# Patient Record
Sex: Female | Born: 1940 | Race: White | Hispanic: No | Smoking: Former smoker
Health system: Southern US, Community
[De-identification: ages and names within clinical notes are randomized; demographics above are authoritative.]

## PROBLEM LIST (undated history)

## (undated) DIAGNOSIS — E119 Type 2 diabetes mellitus without complications: Secondary | ICD-10-CM

## (undated) DIAGNOSIS — H269 Unspecified cataract: Secondary | ICD-10-CM

## (undated) DIAGNOSIS — Z973 Presence of spectacles and contact lenses: Secondary | ICD-10-CM

## (undated) DIAGNOSIS — L209 Atopic dermatitis, unspecified: Secondary | ICD-10-CM

## (undated) DIAGNOSIS — E785 Hyperlipidemia, unspecified: Secondary | ICD-10-CM

## (undated) DIAGNOSIS — I700 Atherosclerosis of aorta: Secondary | ICD-10-CM

## (undated) DIAGNOSIS — K259 Gastric ulcer, unspecified as acute or chronic, without hemorrhage or perforation: Secondary | ICD-10-CM

## (undated) DIAGNOSIS — M1990 Unspecified osteoarthritis, unspecified site: Secondary | ICD-10-CM

## (undated) DIAGNOSIS — T7840XA Allergy, unspecified, initial encounter: Secondary | ICD-10-CM

## (undated) HISTORY — DX: Unspecified cataract: H26.9

## (undated) HISTORY — PX: ROTATOR CUFF REPAIR: SHX139

## (undated) HISTORY — PX: CHOLECYSTECTOMY: SHX55

## (undated) HISTORY — PX: TUBAL LIGATION: SHX77

## (undated) HISTORY — DX: Gastric ulcer, unspecified as acute or chronic, without hemorrhage or perforation: K25.9

## (undated) HISTORY — DX: Hyperlipidemia, unspecified: E78.5

## (undated) HISTORY — DX: Allergy, unspecified, initial encounter: T78.40XA

---

## 2021-03-04 ENCOUNTER — Ambulatory Visit (INDEPENDENT_AMBULATORY_CARE_PROVIDER_SITE_OTHER): Payer: No Typology Code available for payment source | Admitting: Internal Medicine

## 2021-03-04 ENCOUNTER — Other Ambulatory Visit: Payer: Self-pay

## 2021-03-04 ENCOUNTER — Encounter: Payer: Self-pay | Admitting: Internal Medicine

## 2021-03-04 VITALS — BP 148/82 | HR 90 | Temp 98.1°F | Resp 16 | Ht 64.0 in | Wt 152.0 lb

## 2021-03-04 DIAGNOSIS — Z789 Other specified health status: Secondary | ICD-10-CM | POA: Diagnosis not present

## 2021-03-04 DIAGNOSIS — R053 Chronic cough: Secondary | ICD-10-CM

## 2021-03-04 DIAGNOSIS — Z8711 Personal history of peptic ulcer disease: Secondary | ICD-10-CM

## 2021-03-04 DIAGNOSIS — E785 Hyperlipidemia, unspecified: Secondary | ICD-10-CM | POA: Diagnosis not present

## 2021-03-04 LAB — CBC WITH DIFFERENTIAL/PLATELET
Absolute Monocytes: 655 cells/uL (ref 200–950)
Basophils Absolute: 62 cells/uL (ref 0–200)
Basophils Relative: 0.8 %
Eosinophils Absolute: 406 cells/uL (ref 15–500)
Eosinophils Relative: 5.2 %
HCT: 44.7 % (ref 35.0–45.0)
Hemoglobin: 14.7 g/dL (ref 11.7–15.5)
Lymphs Abs: 2028 cells/uL (ref 850–3900)
MCH: 27.5 pg (ref 27.0–33.0)
MCHC: 32.9 g/dL (ref 32.0–36.0)
MCV: 83.7 fL (ref 80.0–100.0)
MPV: 11.2 fL (ref 7.5–12.5)
Monocytes Relative: 8.4 %
Neutro Abs: 4649 cells/uL (ref 1500–7800)
Neutrophils Relative %: 59.6 %
Platelets: 260 10*3/uL (ref 140–400)
RBC: 5.34 10*6/uL — ABNORMAL HIGH (ref 3.80–5.10)
RDW: 15.1 % — ABNORMAL HIGH (ref 11.0–15.0)
Total Lymphocyte: 26 %
WBC: 7.8 10*3/uL (ref 3.8–10.8)

## 2021-03-04 LAB — COMPLETE METABOLIC PANEL WITH GFR
AG Ratio: 1.3 (calc) (ref 1.0–2.5)
ALT: 8 U/L (ref 6–29)
AST: 15 U/L (ref 10–35)
Albumin: 4.2 g/dL (ref 3.6–5.1)
Alkaline phosphatase (APISO): 75 U/L (ref 37–153)
BUN: 17 mg/dL (ref 7–25)
CO2: 29 mmol/L (ref 20–32)
Calcium: 9.8 mg/dL (ref 8.6–10.4)
Chloride: 103 mmol/L (ref 98–110)
Creat: 0.7 mg/dL (ref 0.60–0.95)
Globulin: 3.3 g/dL (calc) (ref 1.9–3.7)
Glucose, Bld: 106 mg/dL — ABNORMAL HIGH (ref 65–99)
Potassium: 4.2 mmol/L (ref 3.5–5.3)
Sodium: 141 mmol/L (ref 135–146)
Total Bilirubin: 0.8 mg/dL (ref 0.2–1.2)
Total Protein: 7.5 g/dL (ref 6.1–8.1)
eGFR: 87 mL/min/{1.73_m2} (ref >=60)

## 2021-03-04 LAB — LIPID PANEL
Cholesterol: 324 mg/dL — ABNORMAL HIGH (ref <200)
HDL: 65 mg/dL (ref >=50)
LDL Cholesterol (Calc): 230 mg/dL (calc) — ABNORMAL HIGH
Non-HDL Cholesterol (Calc): 259 mg/dL (calc) — ABNORMAL HIGH (ref <130)
Total CHOL/HDL Ratio: 5 (calc) — ABNORMAL HIGH (ref <5.0)
Triglycerides: 137 mg/dL (ref <150)

## 2021-03-04 MED ORDER — PANTOPRAZOLE SODIUM 40 MG PO TBEC: 40.0000 mg | DELAYED_RELEASE_TABLET | Freq: Every day | ORAL | 3 refills | Status: DC

## 2021-03-04 NOTE — Assessment & Plan Note (Signed)
Stable, obtain CBC, CMP for baseline values. Switch from Pepcid to Protonix, can continue Carafate. Discuss lifestyle changes, foods and medications to avoid.

## 2021-03-04 NOTE — Assessment & Plan Note (Signed)
History of total cholesterol >300, cannot tolerate statins due to myalgias in the past. Will check lipid panel today, patient is fasting.

## 2021-03-04 NOTE — Progress Notes (Signed)
New Patient Office Visit  Subjective:  Patient ID: Kirsten Perez, female    DOB: [birth year]  Age: 81 y.o. MRN: [id number]  CC:  Chief Complaint  Patient presents with   Establish Care   Hyperlipidemia    Does not really want to be on statin due to side effects   Cough    Ongoing cough over 6 months   gastric ulcer    Had in summer wants to know if she needs to continue meds    HPI Kirsten Perez presents as a new patient.   Chronic cough:  -Dry cough since ulcer in May 2022 -Denies post-nasal drip  HLD: -Medications: Nothing currently, had been on multiple statins in the past and couldn't tolerate due to myalgias. Had been on Repatha as well but worried about cost  -Last lipid panel: total cholesterol >300 (per patient)  Gastric Ulcer: -Currently on Pepcid 40, Carafate 1 g daily -Was hospitalized after having dark red blood in stool in May, had an EGD and found to have a bleeding ulcer -Denies abdominal pain, nausea, vomiting, stool changes and bright red/dark stools currently   Health Maintenance: -Blood work due -Mammogram - does not continue with screening -Colon cancer screening - does not continue with screening -Immunizations: politely declines flu and PNA vaccine  Past Medical History:  Diagnosis Date   Gastric ulcer    Hyperlipidemia     Past Surgical History:  Procedure Laterality Date   CHOLECYSTECTOMY     ROTATOR CUFF REPAIR Right     Family History  Problem Relation Age of Onset   Hypertension Mother    Stroke Mother    Heart disease Father     Social History   Socioeconomic History   Marital status: Widowed    Spouse name: Not on file   Number of children: Not on file   Years of education: Not on file   Highest education level: Not on file  Occupational History   Not on file  Tobacco Use   Smoking status: Former    Types: Cigarettes   Smokeless tobacco: Never  Vaping Use   Vaping Use: Never used  Substance and Sexual Activity    Alcohol use: Never   Drug use: Never   Sexual activity: Not Currently  Other Topics Concern   Not on file  Social History Narrative   Not on file   Social Determinants of Health   Financial Resource Strain: Not on file  Food Insecurity: Not on file  Transportation Needs: Not on file  Physical Activity: Not on file  Stress: Not on file  Social Connections: Not on file  Intimate Partner Violence: Not on file    ROS Review of Systems  Constitutional:  Negative for chills and fever.  Respiratory:  Positive for cough. Negative for shortness of breath.   Cardiovascular:  Negative for chest pain.  Gastrointestinal:  Negative for abdominal pain, blood in stool, constipation, diarrhea, nausea and vomiting.  Neurological:  Negative for dizziness and headaches.   Objective:   Today's Vitals: BP (!) 148/82    Pulse 90    Temp 98.1 F (36.7 C)    Resp 16    Ht 5\' 4"  (1.626 m)    Wt 152 lb (68.9 kg)    SpO2 98%    BMI 26.09 kg/m   Physical Exam Constitutional:      Appearance: Normal appearance.  HENT:     Head: Normocephalic and atraumatic.     Mouth/Throat:  Mouth: Mucous membranes are moist.     Pharynx: Oropharynx is clear.  Eyes:     Conjunctiva/sclera: Conjunctivae normal.  Cardiovascular:     Rate and Rhythm: Normal rate and regular rhythm.  Pulmonary:     Effort: Pulmonary effort is normal.     Breath sounds: Normal breath sounds.  Abdominal:     General: Bowel sounds are normal. There is no distension.     Palpations: Abdomen is soft.     Tenderness: There is no abdominal tenderness.  Musculoskeletal:     Right lower leg: No edema.     Left lower leg: No edema.  Skin:    General: Skin is warm and dry.  Neurological:     General: No focal deficit present.     Mental Status: She is alert. Mental status is at baseline.  Psychiatric:        Mood and Affect: Mood normal.        Behavior: Behavior normal.    Assessment & Plan:   Problem List Items Addressed  This Visit       Other   HLD (hyperlipidemia) - Primary    History of total cholesterol >300, cannot tolerate statins due to myalgias in the past. Will check lipid panel today, patient is fasting.      Relevant Orders   Lipid Profile   Statin intolerance   History of gastric ulcer    Stable, obtain CBC, CMP for baseline values. Switch from Pepcid to Protonix, can continue Carafate. Discuss lifestyle changes, foods and medications to avoid.       Relevant Orders   CBC w/Diff/Platelet   COMPLETE METABOLIC PANEL WITH GFR   Other Visit Diagnoses     Chronic cough - most likely due to silent reflux, treat with PPI and follow up in 6 months.        Outpatient Encounter Medications as of 03/04/2021  Medication Sig   benzonatate (TESSALON) 100 MG capsule Take by mouth 3 (three) times daily as needed for cough.   famotidine (PEPCID) 40 MG tablet Take 40 mg by mouth daily.   Multiple Vitamin (MULTIVITAMIN) capsule Take 1 capsule by mouth daily.   sucralfate (CARAFATE) 1 g tablet Take 1 g by mouth 4 (four) times daily -  with meals and at bedtime.   No facility-administered encounter medications on file as of 03/04/2021.    Follow-up: Return in about 6 months (around 09/01/2021).   Margarita Mail, DO

## 2021-03-04 NOTE — Patient Instructions (Signed)
It was great seeing you today!  Plan discussed at today's visit: -Blood work ordered today, results will be uploaded to MyChart.  -Stomach medication switched to Protonix 40 mg daily, can continue Carafate -Can continue to take cough medication as needed  -Avoid anti-inflammatory medication like Advil, Ibuprofen, etc ok to to Tylenol  -Please monitor stools for any kind of blood, bright red or black and please present to the ER if this happens   Follow up in: 6 months   Take care and let us know if you have any questions or concerns prior to your next visit.  Dr. Caralee Ates

## 2021-03-19 MED ORDER — REPATHA SURECLICK 140 MG/ML ~~LOC~~ SOAJ: 140.0000 mg | SUBCUTANEOUS | 3 refills | Status: DC

## 2021-03-19 NOTE — Addendum Note (Signed)
Addended by: Margarita Mail on: 03/19/2021 10:18 AM   Modules accepted: Orders

## 2021-03-20 ENCOUNTER — Telehealth: Payer: Self-pay

## 2021-03-20 NOTE — Telephone Encounter (Signed)
Copied from CRM [id number]. Topic: General - Inquiry >> Mar 20, 2021 11:01 AM Crist Infante wrote: Reason for CRM: pt got notice from her pharmacy that the Evolocumab (REPATHA SURECLICK) 140 MG/ML SOAJ will be $100 /mo. Pt wants to know is that with her insurance, b/c she cannot afford this a month.

## 2021-03-21 NOTE — Telephone Encounter (Signed)
Received prior auth from pharmacy we try

## 2021-04-04 ENCOUNTER — Ambulatory Visit: Payer: No Typology Code available for payment source

## 2021-04-04 ENCOUNTER — Telehealth: Payer: Self-pay

## 2021-04-04 DIAGNOSIS — Z8711 Personal history of peptic ulcer disease: Secondary | ICD-10-CM

## 2021-04-04 NOTE — Telephone Encounter (Signed)
Patient do want an appointment she states she has already had a egd with different provider and dont feel this appointment is necessary ?

## 2021-04-04 NOTE — Telephone Encounter (Signed)
Patient was scheduled for Annual Wellness visit today but unable to verify last date of AWV due to pt  is new to office and we do not have any records from previous provider.  ? ?Pt's previous PCP Dr. Nicholaus Bloom in [place]. Ph # [phone]) [phone]. I called and left a message to fax records to our office. Pt states she did not sign a record release at last office visit and her appt today was virtual.  ? ?Pt states she is still experiencing a frequent dry cough that is occasionally productive with clear phlegm. She is taking benzonatate every 3-4 days PRN and using cough drops. Pt does not think her cough is reflux related and would like to know if she needs to be referred pulmonology or gastroenterology here as she moved to [facility] in Dec 2022.  ? ?Pt does not have a future appt with Dr. Caralee Ates, last note states to follow up in 6 months. Please advise patient if she needs to be seen for further evaluation or provide recommendations pertaining to her cough. Thank you.  ?

## 2021-04-25 NOTE — Progress Notes (Deleted)
Erroneous encounter. Need prior records to verify patient's eligibility for AWV.  ?

## 2021-05-16 DIAGNOSIS — J309 Allergic rhinitis, unspecified: Secondary | ICD-10-CM | POA: Diagnosis not present

## 2021-05-16 DIAGNOSIS — R053 Chronic cough: Secondary | ICD-10-CM | POA: Diagnosis not present

## 2021-05-31 DIAGNOSIS — R059 Cough, unspecified: Secondary | ICD-10-CM | POA: Diagnosis not present

## 2021-05-31 DIAGNOSIS — Z87898 Personal history of other specified conditions: Secondary | ICD-10-CM | POA: Diagnosis not present

## 2021-05-31 DIAGNOSIS — J40 Bronchitis, not specified as acute or chronic: Secondary | ICD-10-CM | POA: Diagnosis not present

## 2021-06-03 ENCOUNTER — Telehealth: Payer: Self-pay

## 2021-06-03 NOTE — Telephone Encounter (Signed)
Pt notified, she can take meds as directed ?

## 2021-06-03 NOTE — Telephone Encounter (Signed)
Copied from CRM [id number]. Topic: Appointment Scheduling - Scheduling Inquiry for Clinic ?>> Jun 03, 2021  2:47 PM Kirsten Perez wrote: ?Reason for CRM: Pt has an appt tomorrow and wanted to know if Dr. Caralee Ates would also be doing blood work for her cholesterol / she wanted to make sure it was ok to take medications in the morning and if they would effect blood work especially an RX she was prescribed out of town for her cough/ please advise asap ?

## 2021-06-04 ENCOUNTER — Ambulatory Visit (INDEPENDENT_AMBULATORY_CARE_PROVIDER_SITE_OTHER): Payer: No Typology Code available for payment source | Admitting: Internal Medicine

## 2021-06-04 ENCOUNTER — Encounter: Payer: Self-pay | Admitting: Internal Medicine

## 2021-06-04 VITALS — BP 138/62 | HR 89 | Temp 98.1°F | Resp 18 | Wt 157.2 lb

## 2021-06-04 DIAGNOSIS — R053 Chronic cough: Secondary | ICD-10-CM | POA: Diagnosis not present

## 2021-06-04 DIAGNOSIS — Z8711 Personal history of peptic ulcer disease: Secondary | ICD-10-CM

## 2021-06-04 MED ORDER — PULMICORT FLEXHALER 90 MCG/ACT IN AEPB: 1.0000 | INHALATION_SPRAY | Freq: Two times a day (BID) | RESPIRATORY_TRACT | 0 refills | Status: DC

## 2021-06-04 MED ORDER — PANTOPRAZOLE SODIUM 40 MG PO TBEC: 40.0000 mg | DELAYED_RELEASE_TABLET | Freq: Every day | ORAL | 1 refills | Status: DC

## 2021-06-04 MED ORDER — SUCRALFATE 1 G PO TABS
1.0000 g | ORAL_TABLET | Freq: Three times a day (TID) | ORAL | 1 refills | Status: DC
Start: 2021-06-04 — End: 2022-01-17

## 2021-06-04 MED ORDER — BENZONATATE 100 MG PO CAPS: 100.0000 mg | ORAL_CAPSULE | Freq: Three times a day (TID) | ORAL | 0 refills | Status: DC | PRN

## 2021-06-04 NOTE — Patient Instructions (Addendum)
It was great seeing you today! ? ?Plan discussed at today's visit: ?-Finish medrol dosepack ?-use steroid inhaler (Pulmicort) daily and rinse out mouth afterwards ?-Can use Albuterol as needed ever 2-4 hours for wheezing, etc.  ?-Referrals to GI and lung doctor today  ? ?Follow up in: 1 month  ? ?Take care and let us know if you have any questions or concerns prior to your next visit. ? ?Dr. Caralee Ates ? ?

## 2021-06-04 NOTE — Progress Notes (Signed)
? ?Acute Office Visit ? ?Subjective:  ? ?  ?Patient ID: Kirsten Perez, female    DOB: Sep 27, 1940, 81 y.o.   MRN: [id number] ? ?Chief Complaint  ?Patient presents with  ? Cough  ?  Onset months  ? ? ?HPI ?Patient is in today for cough. Symptoms have been going on now for about 3 months, cough getting worse.  Cough is dry in nature, occasionally will be able to cough up a small amount of clear mucus.  She did go to urgent care [place] was [place], she was not given any prescription medication was told her symptoms were due to seasonal allergies.  It was recommended she start an oral antihistamine.  She then saw a provider at [facility], who did a chest x-ray which was normal.  She was then started on albuterol as needed and Medrol Dosepak.  She has about 2 days left of her Medrol Dosepak.  She states she has not noticed any change in her symptoms with this medication.  She is also using albuterol inhaler, however she is uncertain if she is using the inhaler correctly and getting the full dose of medication.  She notes no changes in her symptoms with using the albuterol inhaler.  She has no history of asthma or COPD.  She does not smoke.  Chest [facility] through [facility] negative on 05/31/21, however I am unable to view these records.  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  She does occasionally have some breakthrough acid reflux symptoms, such as epigastric pain and burning type pain in her esophagus.  She denies nausea or vomiting.  Appetite is good and weight is stable. ? ?URI Compliant:  ?-Worst symptom: cough ?-Fever: no ?-Cough: yes, dry ?-Shortness of breath: no ?-Wheezing: yes ?-Chest pain: no ?-Chest tightness: yes ?-Chest congestion: yes ?-Nasal congestion: no ?-Runny nose: no ?-Post nasal drip: no ?-Sneezing: yes ?-Sore throat: no ?-Swollen glands: no ?-Sinus pressure: yes ?-Headache: no ?-Face pain: no ?-Ear pain: no  ?-Ear pressure: no  ?-Eyes  red/itching:no ?-Eye drainage/crusting: no  ?-Vomiting: no ?-Rash: no ?-Fatigue: yes with activity buy not all the time  ?-Sick contacts: no ?-Context: worse ?-Treatments attempted: Albuterol inhaler; Medrol dosepack  ? ? ?Review of Systems  ?Constitutional:  Negative for chills, fever and weight loss.  ?HENT:  Negative for congestion, ear pain, sinus pain and sore throat.   ?Eyes:  Negative for discharge and redness.  ?Respiratory:  Positive for cough and wheezing. Negative for sputum production and shortness of breath.   ?Cardiovascular:  Negative for chest pain.  ?Gastrointestinal:  Positive for heartburn. Negative for abdominal pain, nausea and vomiting.  ?Neurological:  Negative for headaches.  ? ? ?   ?Objective:  ?  ?BP 138/62   Pulse 89   Temp 98.1 ?F (36.7 ?C)   Resp 18   Wt 157 lb 3.2 oz (71.3 kg)   SpO2 97%   BMI 26.98 kg/m?  ?BP Readings from Last 3 Encounters:  ?06/04/21 138/62  ?03/04/21 (!) 148/82  ? ?Wt Readings from Last 3 Encounters:  ?06/04/21 157 lb 3.2 oz (71.3 kg)  ?03/04/21 152 lb (68.9 kg)  ? ?  ? ?Physical Exam ?Constitutional:   ?   Appearance: Normal appearance.  ?HENT:  ?   Head: Normocephalic and atraumatic.  ?   Right Ear: Tympanic membrane, ear canal and external ear normal.  ?   Left Ear: Tympanic membrane, ear  canal and external ear normal.  ?   Nose: Nose normal.  ?   Mouth/Throat:  ?   Mouth: Mucous membranes are moist.  ?   Comments: Mild postnasal drip present, no exudate ?Eyes:  ?   Conjunctiva/sclera: Conjunctivae normal.  ?Cardiovascular:  ?   Rate and Rhythm: Normal rate and regular rhythm.  ?Pulmonary:  ?   Effort: Pulmonary effort is normal.  ?   Breath sounds: Normal breath sounds. No wheezing, rhonchi or rales.  ?   Comments: Decreased air movement throughout, however no rhonchi or wheezing present ?Musculoskeletal:  ?   Right lower leg: No edema.  ?   Left lower leg: No edema.  ?Skin: ?   General: Skin is warm and dry.  ?Neurological:  ?   General: No focal deficit  present.  ?   Mental Status: She is alert. Mental status is at baseline.  ?Psychiatric:     ?   Mood and Affect: Mood normal.     ?   Behavior: Behavior normal.  ? ? ?No results found for any visits on 06/04/21. ? ? ?   ?Assessment & Plan:  ? ?1. Chronic cough: Uncertain etiology, does not appear to be infectious as is been going on for 3 months.  She did have a normal chest x-ray through [facility] last week, however I am not able to view these results.  Main symptoms are chronic dry cough and wheezing.  I will send her in a steroid inhaler and we also discussed using a spacer as well to ensure she is getting the full dose of medication.  She will use a steroid inhaler daily, rinse out her mouth afterwards and then use albuterol as needed for wheezing and shortness of breath.  I will refill her cough suppressant medication, however I am concerned this may be asthma related and she will be sent to pulmonology for PFTs.  She will continue her Medrol Dosepak. ? ?- benzonatate (TESSALON) 100 MG capsule; Take 1 capsule (100 mg total) by mouth 3 (three) times daily as needed for cough.  Dispense: 30 capsule; Refill: 0 ?- Budesonide (PULMICORT FLEXHALER) 90 MCG/ACT inhaler; Inhale 1 puff into the lungs 2 (two) times daily.  Dispense: 1 each; Refill: 0 ?- Ambulatory referral to Pulmonology ? ?2. History of gastric ulcer: Questionable if this is contributing to chronic cough.  She does take Protonix 40 mg daily, this will be refilled today.  Her Carafate 1 g 4 times a day with meals will be refilled as well.  She will be referred to GI for continued medical management of duodenal ulcer. ? ?- pantoprazole (PROTONIX) 40 MG tablet; Take 1 tablet (40 mg total) by mouth daily.  Dispense: 90 tablet; Refill: 1 ?- sucralfate (CARAFATE) 1 g tablet; Take 1 tablet (1 g total) by mouth 4 (four) times daily -  with meals and at bedtime.  Dispense: 360 tablet; Refill: 1 ?- Ambulatory referral to Gastroenterology ? ? ?Meds ordered this  encounter  ?Medications  ? pantoprazole (PROTONIX) 40 MG tablet  ?  Sig: Take 1 tablet (40 mg total) by mouth daily.  ?  Dispense:  90 tablet  ?  Refill:  1  ? benzonatate (TESSALON) 100 MG capsule  ?  Sig: Take 1 capsule (100 mg total) by mouth 3 (three) times daily as needed for cough.  ?  Dispense:  30 capsule  ?  Refill:  0  ? sucralfate (CARAFATE) 1 g tablet  ?  Sig: Take 1 tablet (1 g total) by mouth 4 (four) times daily -  with meals and at bedtime.  ?  Dispense:  360 tablet  ?  Refill:  1  ? Budesonide (PULMICORT FLEXHALER) 90 MCG/ACT inhaler  ?  Sig: Inhale 1 puff into the lungs 2 (two) times daily.  ?  Dispense:  1 each  ?  Refill:  0  ? ? ?Return in about 4 weeks (around 07/02/2021). ? ?Margarita Mail, DO ? ? ?

## 2021-06-10 ENCOUNTER — Ambulatory Visit: Payer: No Typology Code available for payment source | Admitting: Internal Medicine

## 2021-07-05 ENCOUNTER — Telehealth: Payer: Self-pay | Admitting: Internal Medicine

## 2021-07-05 ENCOUNTER — Ambulatory Visit: Payer: Self-pay | Admitting: *Deleted

## 2021-07-05 DIAGNOSIS — H35371 Puckering of macula, right eye: Secondary | ICD-10-CM | POA: Diagnosis not present

## 2021-07-05 DIAGNOSIS — H524 Presbyopia: Secondary | ICD-10-CM | POA: Diagnosis not present

## 2021-07-05 DIAGNOSIS — Z789 Other specified health status: Secondary | ICD-10-CM

## 2021-07-05 DIAGNOSIS — E785 Hyperlipidemia, unspecified: Secondary | ICD-10-CM

## 2021-07-05 MED ORDER — REPATHA SURECLICK 140 MG/ML ~~LOC~~ SOAJ: 140.0000 mg | SUBCUTANEOUS | 3 refills | Status: DC

## 2021-07-05 NOTE — Telephone Encounter (Signed)
  Chief Complaint: She is needing clarification from Dr. Caralee Ates regarding the Repatha Sureclick 140 mg/ml SOAJ.  Does she want pt to continue taking it?   Her shot is due this Tues.   She has an appt. With Dr. Caralee Ates this [place].  If Dr. Caralee Ates wants her to continue it she will need a refill. Symptoms: N/A Frequency: N/A Pertinent Negatives: Patient denies N/A Disposition: [] ED /[] Urgent Care (no appt availability in office) / [] Appointment(In office/virtual)/ []  [place] [facility]/ [] Home Care/ [] Refused Recommended Disposition /[] [facility] Follow-up with PCP Additional Notes: Message sent to Dr. 

## 2021-07-05 NOTE — Telephone Encounter (Deleted)
Pt would like to know if PCP Caralee Ates would like for her to continue taking medication Evolocumab (REPATHA SURECLICK) 140 MG/ML SOAJ  Pt stated she is unsure if PCP wants her to continue taking it. However, if she does, she needs a refill.     Please advise.

## 2021-07-05 NOTE — Telephone Encounter (Deleted)
Error

## 2021-07-05 NOTE — Addendum Note (Signed)
Addended by: Margarita Mail on: 07/05/2021 01:01 PM   Modules accepted: Orders

## 2021-07-05 NOTE — Telephone Encounter (Signed)
Message from [place] sent at 07/05/2021  9:42 AM EDT  Summary: Medication Management   Pt would like to know if PCP Caralee Ates would like for her to continue taking medication Evolocumab (REPATHA SURECLICK) 140 MG/ML SOAJ   Pt stated she is unsure if PCP wants her to continue taking it. However, pt mentioned if she does, she needs a refill.     Pt seeking clinical advice.           Call History   Type Contact Phone/Fax User  07/05/2021 09:41 AM EDT Phone (Incoming) [place], [place]      Reason for Disposition  [1] Caller has URGENT medicine question about med that PCP or specialist prescribed AND [2] triager unable to answer question  Answer Assessment - Initial Assessment Questions 1. NAME of MEDICATION: "What medicine are you calling about?"     Repatha Sureclick 2. QUESTION: "What is your question?" (e.g., double dose of medicine, side effect)     Sunday at pharmacy they didn't have it.   They are trying to contact the dr for a refill.   Dr. Caralee Ates' office said the pharmacy hasn't contacted them. 3. PRESCRIBING HCP: "Who prescribed it?" Reason: if prescribed by specialist, call should be referred to that group.     Dr. Caralee Ates 4. SYMPTOMS: "Do you have any symptoms?"     I need to know if I need a refill or what she wants me to do. 5. SEVERITY: If symptoms are present, ask "Are they mild, moderate or severe?"      6. PREGNANCY:  "Is there any chance that you are pregnant?" "When was your last menstrual period?"  Protocols used: Medication Question Call-A-AH

## 2021-07-11 ENCOUNTER — Encounter: Payer: Self-pay | Admitting: Internal Medicine

## 2021-07-11 ENCOUNTER — Ambulatory Visit (INDEPENDENT_AMBULATORY_CARE_PROVIDER_SITE_OTHER): Payer: No Typology Code available for payment source | Admitting: Internal Medicine

## 2021-07-11 VITALS — BP 122/72 | HR 72 | Temp 97.7°F | Resp 16 | Ht 64.0 in | Wt 154.1 lb

## 2021-07-11 DIAGNOSIS — R222 Localized swelling, mass and lump, trunk: Secondary | ICD-10-CM

## 2021-07-11 DIAGNOSIS — J383 Other diseases of vocal cords: Secondary | ICD-10-CM | POA: Diagnosis not present

## 2021-07-11 DIAGNOSIS — K921 Melena: Secondary | ICD-10-CM

## 2021-07-11 DIAGNOSIS — Z8711 Personal history of peptic ulcer disease: Secondary | ICD-10-CM | POA: Diagnosis not present

## 2021-07-11 DIAGNOSIS — K5900 Constipation, unspecified: Secondary | ICD-10-CM | POA: Diagnosis not present

## 2021-07-11 DIAGNOSIS — E785 Hyperlipidemia, unspecified: Secondary | ICD-10-CM

## 2021-07-11 NOTE — Progress Notes (Signed)
Established Patient Office Visit  Subjective:  Patient ID: Kirsten Perez, female    DOB: 1940/09/24  Age: 81 y.o. MRN: [id number]  CC:  Chief Complaint  Patient presents with   Follow-up   Hyperlipidemia   Gastroesophageal Reflux    HPI Kirsten Perez is a 81 year old female here for follow up on chronic medical conditions.   Chronic cough:  -Dry cough since ulcer in May 2022 -Denies post-nasal drip -At LOV treated with Medrol dosepack, Pulmicort inhaler and Albuterol PRN but none of these things are helping. Did discuss spacer which she has not tried yet but she does feel like she is fully inhaling the medications -Referral placed to Pulmonology - hasn't seen them yet -Now saying that she feels a whistling sensation to the wheezing, and it feels higher up in her respiratory tract. Denies congestion, sinus pain/pressure. Does nasal steroid sprays but hasn't helped -Also endorsing a fullness above her clavicle on the left side, non-painful. Staying the same, just noticed a few weeks ago. Does also endorse a fullness when swallowing, first noticed issue with pills but doesn't matter the size. No pain with swallowing. No issue with swallowing solid food or liquids.  HLD: -Medications: Repatha, had been on multiple statins in the past and couldn't tolerate due to myalgias. -Last lipid panel: Lipid Panel     Component Value Date/Time   CHOL 324 (H) 03/04/2021 1220   TRIG 137 03/04/2021 1220   HDL 65 03/04/2021 1220   CHOLHDL 5.0 (H) 03/04/2021 1220   LDLCALC 230 (H) 03/04/2021 1220    Gastric Ulcer: -Currently on Protonix 40, Carafate 1 g daily (had been on Pepcid previously, does not notice a difference between Pepcid and Protonix.) -Was hospitalized after having dark red blood in stool in May, 2022 had an EGD and found to have a bleeding ulcer in [place] -Today denies abdominal pain, nausea, vomiting but does endorse pink bloody streaks in stools that lasted about 1 week  about 3 weeks ago.  She does have chronic constipation.  She usually only has a bowel movement about once a week.  She does have to strain to have bowel movements and does have a history of hemorrhoids.  She has not noticed any bright red blood, dark or black stools in the last 3 weeks.  She denies dizziness, fatigue.  Health Maintenance: -Blood work up-to-date, plan to recheck cholesterol today -Mammogram - does not continue with screening -Colon cancer screening - does not continue with screening -Immunizations: politely declines flu and PNA vaccine  Past Medical History:  Diagnosis Date   Gastric ulcer    Hyperlipidemia     Past Surgical History:  Procedure Laterality Date   CHOLECYSTECTOMY     ROTATOR CUFF REPAIR Right     Family History  Problem Relation Age of Onset   Hypertension Mother    Stroke Mother    Heart disease Father     Social History   Socioeconomic History   Marital status: Widowed    Spouse name: Not on file   Number of children: Not on file   Years of education: Not on file   Highest education level: Not on file  Occupational History   Not on file  Tobacco Use   Smoking status: Former    Types: Cigarettes   Smokeless tobacco: Never  Vaping Use   Vaping Use: Never used  Substance and Sexual Activity   Alcohol use: Never   Drug use: Never   Sexual  activity: Not Currently  Other Topics Concern   Not on file  Social History Narrative   Not on file   Social Determinants of Health   Financial Resource Strain: Not on file  Food Insecurity: Not on file  Transportation Needs: Not on file  Physical Activity: Not on file  Stress: Not on file  Social Connections: Not on file  Intimate Partner Violence: Not on file    ROS Review of Systems  Constitutional:  Negative for chills, fatigue and fever.  HENT:  Positive for trouble swallowing and voice change. Negative for congestion, postnasal drip, rhinorrhea, sinus pressure, sinus pain and sore  throat.   Respiratory:  Positive for cough and wheezing. Negative for shortness of breath.   Cardiovascular:  Negative for chest pain.  Gastrointestinal:  Positive for blood in stool. Negative for abdominal pain, constipation, diarrhea, nausea and vomiting.  Neurological:  Negative for dizziness and headaches.    Objective:   Today's Vitals: There were no vitals taken for this visit.  Physical Exam Constitutional:      Appearance: Normal appearance.  HENT:     Head: Normocephalic and atraumatic.     Mouth/Throat:     Mouth: Mucous membranes are moist.     Pharynx: Oropharynx is clear.  Eyes:     Conjunctiva/sclera: Conjunctivae normal.  Neck:     Comments: Fullness palpated in the proximal left supra clavicular area, no pain to palpation Cardiovascular:     Rate and Rhythm: Normal rate and regular rhythm.  Pulmonary:     Effort: Pulmonary effort is normal.     Breath sounds: Normal breath sounds.  Musculoskeletal:     Right lower leg: No edema.     Left lower leg: No edema.  Skin:    General: Skin is warm and dry.  Neurological:     General: No focal deficit present.     Mental Status: She is alert. Mental status is at baseline.  Psychiatric:        Mood and Affect: Mood normal.        Behavior: Behavior normal.     Assessment & Plan:   1. Blood in stool/Constipation, unspecified constipation type/History of gastric ulcer: Patient endorsing stools streaked with pink blood about 3 weeks ago.  This lasted about 1 week.  She does have a GI appointment in September.  She is compliant with her Protonix 40 mg daily, Carafate 1 g daily.  Denies abdominal pain, nausea, vomiting or diarrhea today but does have a history of constipation and also known history of hemorrhoids.  Blood in stool she is describing is most likely secondary to hemorrhoids, however we will obtain a fecal occult test today and check hemoglobin to confirm that it is stable.  Discussed treatment of constipation  with increasing hydration, dietary fiber and MiraLAX.  Recheck in 3 months.  - CBC w/Diff/Platelet  2. Hyperlipidemia, unspecified hyperlipidemia type: Last lipid panel in February, LDL elevated at 230.  The patient has been on Repatha since February and would like to recheck a lipid panel to make sure the medication is working, as it is a high co-pay for her.  - Lipid Profile  3. Vocal cord dysfunction: Has been dealing with this cough and now wheezing for about a year.  Oral steroids, nasal steroids, inhaled steroids and albuterol not changing symptoms.  Now she is describing a whistling like sound coming from her upper airway.  At this point we will refer to ENT for complete  exam of the larynx and vocal cords, suspect vocal cord dysfunction.  - Ambulatory referral to ENT  4. Supraclavicular fossa fullness: Patient also describing difficulty with swallowing pills, which is new onset as well as fullness around her throat area.  I did palpate a small area of fullness located behind her left clavicular head in the supraclavicular fossa.  We will obtain an ultrasound to further evaluate this.  - US Soft Tissue Head/Neck (NON-THYROID); Future  Follow-up: Return in about 3 months (around 10/11/2021).   Margarita Mail, DO

## 2021-07-11 NOTE — Patient Instructions (Addendum)
It was great seeing you today!  Plan discussed at today's visit: -Blood work ordered today, results will be uploaded to MyChart.  -Stool blood cards given today  -Referral to ENT placed  -For constipation, increase hydration dietary fiber (can use Benefiber but start with a quarter dose) and can try Miralax as well (goal is to have one soft bowel movement a day)  Follow up in: 3 months  Take care and let Kirsten Perez know if you have any questions or concerns prior to your next visit.  Dr. Caralee Ates  Peptic Ulcer Eating Plan A peptic ulcer is a sore in the lining of the stomach (gastric ulcer) or the first part of the small intestine (duodenal ulcer). These sores are also called stomach ulcers. When ulcers develop, they can cause a burning feeling in the stomach as well as bloating, nausea, vomiting, and poor appetite. If you have a history of peptic ulcers, it is important to keep track of what foods and drinks cause symptoms. What are tips for following this plan?  Eat a healthy, well-balanced diet. This includes: Fresh fruits and vegetables. Eat a variety of colors of fruits and vegetables. Whole grains. Try to make sure at least half of the grains you eat each day are whole grains. Low-fat dairy. Lean meat, fish, poultry, eggs, beans, and nuts. Healthy fats, such as olive oil, grapeseed oil, or canola oil. Try to eat less than 8 teaspoons of fats and oils each day. Avoid foods that cause irritation or pain. These may be different for different people. Keep a food diary to identify foods that cause symptoms. Avoid processed foods that have added salt and sugar. Avoid drinking alcohol. Avoid drinks with caffeine, such as cola, black tea, energy drinks, and coffee. Recommended foods Grains Whole grains. Vegetables All fresh or frozen vegetables. Low-sodium canned vegetables. Fruits All fresh, frozen, or dried fruit. Fruit canned in juice. Meats and other protein foods Lean cuts of meat.  Skinless poultry. Fresh or canned fish. Eggs. Tofu. Nuts and nut butter. Dried beans. Low-sodium canned beans. Dairy Low-fat or nonfat (skim) milk. Nonfat or low-fat yogurt. Nonfat or low-fat cheese. Beverages Water. Soy or nut milks. Caffeine-free soft drinks. Herbal tea. Fats and oils Olive oil. Canola oil. Grapeseed oil. Sunflower oil. Seasoning and other foods Low-fat salad dressing. Ketchup. Low-fat mayonnaise. All spices except pepper. Low-sodium seasoning mixes. The items listed above may not be a complete list of foods and beverages you can eat. Contact a dietitian for more information. Foods to avoid Meats and other protein foods Fatty meats. Fried meats. Any meat that causes symptoms. Dairy Whole milk. Ice cream. Cream. Chocolate milk. Beverages Alcohol. Coffee. Cola and energy drinks. Black or green tea. Cocoa. Fats and oils Butter. Lard. Ghee. Seasoning and other foods Pepper. Hot sauce. Any seasonings or condiments that cause symptoms. The items listed above may not be a complete list of foods and beverages that you should avoid. Contact a dietitian for more information. Summary Peptic ulcers can cause burning in the stomach as well as bloating, nausea, vomiting, and poor appetite. You may be able to limit symptoms by avoiding foods that make you feel worse. Work with your dietitian or health care provider to identify foods that cause symptoms. This may include caffeinated drinks, alcohol, or pepper. This information is not intended to replace advice given to you by your health care provider. Make sure you discuss any questions you have with your health care provider. Document Revised: 08/24/2020 Document Reviewed: 08/24/2020  Elsevier Patient Education  2023 Elsevier Inc.   High-Fiber Eating Plan Fiber, also called dietary fiber, is a type of carbohydrate. It is found foods such as fruits, vegetables, whole grains, and beans. A high-fiber diet can have many health  benefits. Your health care provider may recommend a high-fiber diet to help: Prevent constipation. Fiber can make your bowel movements more regular. Lower your cholesterol. Relieve the following conditions: Inflammation of veins in the anus (hemorrhoids). Inflammation of specific areas of the digestive tract (uncomplicated diverticulosis). A problem of the large intestine, also called the colon, that sometimes causes pain and diarrhea (irritable bowel syndrome, or IBS). Prevent overeating as part of a weight-loss plan. Prevent heart disease, type 2 diabetes, and certain cancers. What are tips for following this plan? Reading food labels  Check the nutrition facts label on food products for the amount of dietary fiber. Choose foods that have 5 grams of fiber or more per serving. The goals for recommended daily fiber intake include: Men (age 88 or younger): 34-38 g. Men (over age 57): 28-34 g. Women (age 66 or younger): 25-28 g. Women (over age 55): 22-25 g. Your daily fiber goal is _______22______ g. Shopping Choose whole fruits and vegetables instead of processed forms, such as apple juice or applesauce. Choose a wide variety of high-fiber foods such as avocados, lentils, oats, and kidney beans. Read the nutrition facts label of the foods you choose. Be aware of foods with added fiber. These foods often have high sugar and sodium amounts per serving. Cooking Use whole-grain flour for baking and cooking. Cook with brown rice instead of white rice. Meal planning Start the day with a breakfast that is high in fiber, such as a cereal that contains 5 g of fiber or more per serving. Eat breads and cereals that are made with whole-grain flour instead of refined flour or white flour. Eat brown rice, bulgur wheat, or millet instead of white rice. Use beans in place of meat in soups, salads, and pasta dishes. Be sure that half of the grains you eat each day are whole grains. General  information You can get the recommended daily intake of dietary fiber by: Eating a variety of fruits, vegetables, grains, nuts, and beans. Taking a fiber supplement if you are not able to take in enough fiber in your diet. It is better to get fiber through food than from a supplement. Gradually increase how much fiber you consume. If you increase your intake of dietary fiber too quickly, you may have bloating, cramping, or gas. Drink plenty of water to help you digest fiber. Choose high-fiber snacks, such as berries, raw vegetables, nuts, and popcorn. What foods should I eat? Fruits Berries. Pears. Apples. Oranges. Avocado. Prunes and raisins. Dried figs. Vegetables Sweet potatoes. Spinach. Kale. Artichokes. Cabbage. Broccoli. Cauliflower. Green peas. Carrots. Squash. Grains Whole-grain breads. Multigrain cereal. Oats and oatmeal. Brown rice. Barley. Bulgur wheat. Millet. Quinoa. Bran muffins. Popcorn. Rye wafer crackers. Meats and other proteins Navy beans, kidney beans, and pinto beans. Soybeans. Split peas. Lentils. Nuts and seeds. Dairy Fiber-fortified yogurt. Beverages Fiber-fortified soy milk. Fiber-fortified orange juice. Other foods Fiber bars. The items listed above may not be a complete list of recommended foods and beverages. Contact a dietitian for more information. What foods should I avoid? Fruits Fruit juice. Cooked, strained fruit. Vegetables Fried potatoes. Canned vegetables. Well-cooked vegetables. Grains White bread. Pasta made with refined flour. White rice. Meats and other proteins Fatty cuts of meat. Fried chicken or fried  fish. Dairy Milk. Yogurt. Cream cheese. Sour cream. Fats and oils Butters. Beverages Soft drinks. Other foods Cakes and pastries. The items listed above may not be a complete list of foods and beverages to avoid. Talk with your dietitian about what choices are best for you. Summary Fiber is a type of carbohydrate. It is found in foods  such as fruits, vegetables, whole grains, and beans. A high-fiber diet has many benefits. It can help to prevent constipation, lower blood cholesterol, aid weight loss, and reduce your risk of heart disease, diabetes, and certain cancers. Increase your intake of fiber gradually. Increasing fiber too quickly may cause cramping, bloating, and gas. Drink plenty of water while you increase the amount of fiber you consume. The best sources of fiber include whole fruits and vegetables, whole grains, nuts, seeds, and beans. This information is not intended to replace advice given to you by your health care provider. Make sure you discuss any questions you have with your health care provider. Document Revised: 05/19/2019 Document Reviewed: 05/19/2019 Elsevier Patient Education  2023 [employer].

## 2021-07-12 LAB — CBC WITH DIFFERENTIAL/PLATELET
Absolute Monocytes: 818 cells/uL (ref 200–950)
Basophils Absolute: 87 cells/uL (ref 0–200)
Basophils Relative: 1 %
Eosinophils Absolute: 713 cells/uL — ABNORMAL HIGH (ref 15–500)
Eosinophils Relative: 8.2 %
HCT: 43.6 % (ref 35.0–45.0)
Hemoglobin: 14.9 g/dL (ref 11.7–15.5)
Lymphs Abs: 2593 cells/uL (ref 850–3900)
MCH: 28.7 pg (ref 27.0–33.0)
MCHC: 34.2 g/dL (ref 32.0–36.0)
MCV: 84 fL (ref 80.0–100.0)
MPV: 11.2 fL (ref 7.5–12.5)
Monocytes Relative: 9.4 %
Neutro Abs: 4489 cells/uL (ref 1500–7800)
Neutrophils Relative %: 51.6 %
Platelets: 317 10*3/uL (ref 140–400)
RBC: 5.19 10*6/uL — ABNORMAL HIGH (ref 3.80–5.10)
RDW: 13.7 % (ref 11.0–15.0)
Total Lymphocyte: 29.8 %
WBC: 8.7 10*3/uL (ref 3.8–10.8)

## 2021-07-12 LAB — LIPID PANEL
Cholesterol: 207 mg/dL — ABNORMAL HIGH (ref <200)
HDL: 68 mg/dL (ref >=50)
LDL Cholesterol (Calc): 120 mg/dL (calc) — ABNORMAL HIGH
Non-HDL Cholesterol (Calc): 139 mg/dL (calc) — ABNORMAL HIGH (ref <130)
Total CHOL/HDL Ratio: 3 (calc) (ref <5.0)
Triglycerides: 88 mg/dL (ref <150)

## 2021-07-17 ENCOUNTER — Ambulatory Visit
Admission: RE | Admit: 2021-07-17 | Discharge: 2021-07-17 | Disposition: A | Discharge status: Home / Self Care | Payer: No Typology Code available for payment source | Source: Ambulatory Visit | Attending: Internal Medicine | Admitting: Internal Medicine

## 2021-07-17 DIAGNOSIS — E041 Nontoxic single thyroid nodule: Secondary | ICD-10-CM | POA: Diagnosis not present

## 2021-07-17 DIAGNOSIS — R222 Localized swelling, mass and lump, trunk: Secondary | ICD-10-CM | POA: Diagnosis not present

## 2021-08-27 DIAGNOSIS — R053 Chronic cough: Secondary | ICD-10-CM | POA: Diagnosis not present

## 2021-08-27 DIAGNOSIS — R1314 Dysphagia, pharyngoesophageal phase: Secondary | ICD-10-CM | POA: Diagnosis not present

## 2021-09-02 ENCOUNTER — Other Ambulatory Visit: Payer: Self-pay | Admitting: Otolaryngology

## 2021-09-03 ENCOUNTER — Other Ambulatory Visit: Payer: Self-pay | Admitting: Otolaryngology

## 2021-09-03 DIAGNOSIS — Y844 Aspiration of fluid as the cause of abnormal reaction of the patient, or of later complication, without mention of misadventure at the time of the procedure: Secondary | ICD-10-CM

## 2021-09-03 DIAGNOSIS — R058 Other specified cough: Secondary | ICD-10-CM

## 2021-09-06 ENCOUNTER — Other Ambulatory Visit
Admission: RE | Admit: 2021-09-06 | Discharge: 2021-09-06 | Disposition: A | Discharge status: Home / Self Care | Payer: No Typology Code available for payment source | Source: Ambulatory Visit | Attending: Internal Medicine | Admitting: Internal Medicine

## 2021-09-06 ENCOUNTER — Ambulatory Visit (INDEPENDENT_AMBULATORY_CARE_PROVIDER_SITE_OTHER): Payer: No Typology Code available for payment source | Admitting: Internal Medicine

## 2021-09-06 ENCOUNTER — Encounter: Payer: Self-pay | Admitting: Internal Medicine

## 2021-09-06 DIAGNOSIS — R058 Other specified cough: Secondary | ICD-10-CM | POA: Insufficient documentation

## 2021-09-06 LAB — CBC WITH DIFFERENTIAL/PLATELET
Abs Immature Granulocytes: 0.02 10*3/uL (ref 0.00–0.07)
Basophils Absolute: 0.1 10*3/uL (ref 0.0–0.1)
Basophils Relative: 1 %
Eosinophils Absolute: 0.4 10*3/uL (ref 0.0–0.5)
Eosinophils Relative: 5 %
HCT: 45.4 % (ref 36.0–46.0)
Hemoglobin: 14.7 g/dL (ref 12.0–15.0)
Immature Granulocytes: 0 %
Lymphocytes Relative: 27 %
Lymphs Abs: 2.4 10*3/uL (ref 0.7–4.0)
MCH: 27.7 pg (ref 26.0–34.0)
MCHC: 32.4 g/dL (ref 30.0–36.0)
MCV: 85.5 fL (ref 80.0–100.0)
Monocytes Absolute: 0.7 10*3/uL (ref 0.1–1.0)
Monocytes Relative: 8 %
Neutro Abs: 5.3 10*3/uL (ref 1.7–7.7)
Neutrophils Relative %: 59 %
Platelets: 257 10*3/uL (ref 150–400)
RBC: 5.31 MIL/uL — ABNORMAL HIGH (ref 3.87–5.11)
RDW: 15.2 % (ref 11.5–15.5)
WBC: 8.9 10*3/uL (ref 4.0–10.5)
nRBC: 0 % (ref 0.0–0.2)

## 2021-09-06 MED ORDER — BENZONATATE 200 MG PO CAPS: 200.0000 mg | ORAL_CAPSULE | Freq: Three times a day (TID) | ORAL | 1 refills | Status: DC | PRN

## 2021-09-06 MED ORDER — BUDESONIDE-FORMOTEROL FUMARATE 80-4.5 MCG/ACT IN AERO: INHALATION_SPRAY | RESPIRATORY_TRACT | 12 refills | Status: DC

## 2021-09-06 MED ORDER — FAMOTIDINE 20 MG PO TABS: ORAL_TABLET | ORAL | 11 refills | Status: DC

## 2021-09-06 NOTE — Progress Notes (Signed)
[name], female    DOB: 1940/04/12   MRN: [id number]   Brief patient profile:  81   yowf moved to [facility] from [facility] 11/2020 p GIB  quit smoking 2016 s sequelae referred to pulmonary clinic in [facility]  09/06/2021 by Dr Caralee Ates  for severe cough/wheeze x Nov 2022 p moved into son's house renovated ? Wet area around house   > ent eval in [place] rx flovent hfa 110 x 2 weeks ? some better "wheeze" but not helping cough      History of Present Illness  09/06/2021  Pulmonary/ 1st office eval/ [facility] / [employer]  Chief Complaint  Patient presents with   Consult  Dyspnea:  only if coughing  Cough: sporadic day and noct min mucoid to point of gag  Sleep: flat bed / one pillow  SABA use: none   No obvious day to day or daytime pattern/variability or assoc excess/ purulent sputum or mucus plugs or hemoptysis or cp or chest tightness, subjective wheeze or overt sinus or hb symptoms.     Also denies any obvious fluctuation of symptoms with weather or environmental changes or other aggravating or alleviating factors except as outlined above   No unusual exposure hx or h/o childhood pna/ asthma or knowledge of premature birth.  Current Allergies, Complete Past Medical History, Past Surgical History, Family History, and Social History were reviewed in [employer] record.  ROS  The following are not active complaints unless bolded Hoarseness, sore throat, dysphagia, dental problems, itching, sneezing,  nasal congestion or discharge of excess mucus or purulent secretions, ear ache,   fever, chills, sweats, unintended wt loss or wt gain, classically pleuritic or exertional cp,  orthopnea pnd or arm/hand swelling  or leg swelling, presyncope, palpitations, abdominal pain, anorexia, nausea, vomiting, diarrhea  or change in bowel habits or change in bladder habits, change in stools or change in urine, dysuria, hematuria,  rash, arthralgias, visual complaints,  headache, numbness, weakness or ataxia or problems with walking or coordination,  change in mood or  memory.           Past Medical History:  Diagnosis Date   Gastric ulcer    Hyperlipidemia     Outpatient Medications Prior to Visit  Medication Sig Dispense Refill   Evolocumab (REPATHA SURECLICK) 140 MG/ML SOAJ Inject 140 mg into the skin every 14 (fourteen) days. 2 mL 3   FLOVENT HFA 110 MCG/ACT inhaler Inhale into the lungs.     Multiple Vitamin (MULTIVITAMIN) capsule Take 1 capsule by mouth daily.     OVER THE COUNTER MEDICATION Take 1 capsule by mouth 2 (two) times daily after a meal. Gavascon     pantoprazole (PROTONIX) 40 MG tablet Take 1 tablet (40 mg total) by mouth daily. 90 tablet 1   sucralfate (CARAFATE) 1 g tablet Take 1 tablet (1 g total) by mouth 4 (four) times daily -  with meals and at bedtime. 360 tablet 1   No facility-administered medications prior to visit.     Objective:     BP (!) 140/80 (BP Location: Right Arm, Patient Position: Sitting, Cuff Size: Normal)   Pulse 81   Temp 98 F (36.7 C) (Oral)   Ht 5\' 3"  (1.6 m)   Wt 153 lb 9.6 oz (69.7 kg)   SpO2 93%   BMI 27.21 kg/m   SpO2: 93 %  Pleasant amb slt hoarse wf nad / vigorous  throat clearing    HEENT :  Oropharynx  clear   Nasal turbinates nl    NECK :  without  apparent JVD/ palpable Nodes/TM    LUNGS: no acc muscle use,  Min barrel  contour chest wall with bilateral  slightly decreased bs s audible wheeze and  without cough on insp or exp maneuvers and min  Hyperresonant  to  percussion bilaterally    CV:  RRR  no s3 or murmur or increase in P2, and no edema   ABD:  soft and nontender with pos end  insp Hoover's  in the supine position.  No bruits or organomegaly appreciated   MS:  Nl gait/ ext warm without deformities Or obvious joint restrictions  calf tenderness, cyanosis or clubbing     SKIN: warm and dry without lesions    NEURO:  alert, approp, nl sensorium with  no motor or  cerebellar deficits apparent.         Cxr in [place] around May 27 2021 was normal      Assessment   Upper airway cough syndrome Onset was Nov 2022  -  Allergy screen 09/06/2021 >  Eos 0. /  IgE   -  Labs ordered 09/06/2021  :     alpha one AT phenotype     When respiratory symptoms begin or become refractory well after a patient reports complete smoking cessation,  especially when this wasn't the case while they were smoking, a red flag is raised based on the work of Dr Kris Mouton which states:  if you quit smoking when your best day FEV1 is still well preserved it is highly unlikely you will progress to severe disease.  That is to say, once the smoking stops,  the symptoms should not suddenly erupt or markedly worsen.  If so, the differential diagnosis should include  obesity/deconditioning,  LPR/Reflux/Aspiration syndromes,  occult CHF, or  especially side effect of medications commonly used in this population.    Rec: Treat upper airwy cough with prn tessalon  rx aggressively for reflux with ppi ac q am  / h2 q pm and diet/ bed blocks and f/u in 6 weeks with pfts  Try off flovent as hasn't helped the cough but if breathing/ wheezing sob worsen off it then try symbicort 80 2bid instead of flovent Based on two studies from NEJM  378; 20 p 1865 (2018) and 380 : p2020-30 (2019) in pts with mild asthma it is reasonable to use low dose symbicort eg 80 2bid "prn" flare in this setting but I emphasized this was only shown with symbicort and takes advantage of the rapid onset of action but is not the same as "rescue therapy" but can be stopped once the acute symptoms have resolved and the need for rescue has been minimized (< 2 x weekly)    PFTs next available s inhalers prior  Labs ordered 09/06/2021  :  Allergy screeb   alpha one AT phenotype    Arrange f/u p pfts available as already has GI doc for GERD   Discussed in detail all the  indications, usual  risks and alternatives  relative to  the benefits with patient who agrees to proceed with w/u and rx as outlined emphasizing can always go back to prior rx if above rx not helping  - The proper method of use, as well as anticipated side effects, of a metered-dose inhaler were discussed and demonstrated to the patient using teach back method and an empty symbicort cannister.  Each maintenance medication was reviewed in detail including emphasizing most importantly the difference between maintenance and prns and under what circumstances the prns are to be triggered using an action plan format where appropriate.  Total time for H and P, chart review, counseling, reviewing hfa  device(s) and generating customized AVS unique to this office visit / same day charting > 30 min for new pt eval           Sandrea Hughs, MD 09/06/2021

## 2021-09-06 NOTE — Assessment & Plan Note (Signed)
Onset was Nov 2022  -  Allergy screen 09/06/2021 >  Eos 0. /  IgE   -  Labs ordered 09/06/2021  :     alpha one AT phenotype     When respiratory symptoms begin or become refractory well after a patient reports complete smoking cessation,  especially when this wasn't the case while they were smoking, a red flag is raised based on the work of Dr Primitivo Gauze which states:  if you quit smoking when your best day FEV1 is still well preserved it is highly unlikely you will progress to severe disease.  That is to say, once the smoking stops,  the symptoms should not suddenly erupt or markedly worsen.  If so, the differential diagnosis should include  obesity/deconditioning,  LPR/Reflux/Aspiration syndromes,  occult CHF, or  especially side effect of medications commonly used in this population.    Rec: Treat upper airwy cough with prn tessalon  rx aggressively for reflux with ppi ac q am  / h2 q pm and diet/ bed blocks and f/u in 6 weeks with pfts  Try off flovent as hasn't helped the cough but if breathing/ wheezing sob worsen off it then try symbicort 80 2bid instead of flovent Based on two studies from NEJM  378; 20 p 1865 (2018) and 380 : p2020-30 (2019) in pts with mild asthma it is reasonable to use low dose symbicort eg 80 2bid "prn" flare in this setting but I emphasized this was only shown with symbicort and takes advantage of the rapid onset of action but is not the same as "rescue therapy" but can be stopped once the acute symptoms have resolved and the need for rescue has been minimized (< 2 x weekly)    PFTs next available s inhalers prior  Labs ordered 09/06/2021  :  Allergy screeb   alpha one AT phenotype    Arrange f/u p pfts available as already has GI doc for GERD   Discussed in detail all the  indications, usual  risks and alternatives  relative to the benefits with patient who agrees to proceed with w/u and rx as outlined emphasizing can always go back to prior rx if above rx not  helping  - The proper method of use, as well as anticipated side effects, of a metered-dose inhaler were discussed and demonstrated to the patient using teach back method and an empty symbicort cannister.          Each maintenance medication was reviewed in detail including emphasizing most importantly the difference between maintenance and prns and under what circumstances the prns are to be triggered using an action plan format where appropriate.  Total time for H and P, chart review, counseling, reviewing hfa  device(s) and generating customized AVS unique to this office visit / same day charting > 30 min for new pt eval

## 2021-09-06 NOTE — Patient Instructions (Addendum)
Pantoprazole (protonix) 40 mg   Take  30-60 min before first meal of the day and Pepcid (famotidine)  20 mg after supper until return to office - this is the best way to tell whether stomach acid is contributing to your problem.    GERD (REFLUX)  is an extremely common cause of respiratory symptoms just like yours , many times with no obvious heartburn at all.    It can be treated with medication, but also with lifestyle changes including elevation of the head of your bed (ideally with 6 -8inch blocks under the headboard of your bed),  Smoking cessation, avoidance of late meals, excessive alcohol, and avoid fatty foods, chocolate, peppermint, colas, red wine, and acidic juices such as orange juice.  NO MINT OR MENTHOL PRODUCTS SO NO COUGH DROPS  USE SUGARLESS CANDY INSTEAD (Jolley ranchers or Stover's or Life Savers) or even ice chips will also do - the key is to swallow to prevent all throat clearing. NO OIL BASED VITAMINS - use powdered substitutes.  Avoid fish oil when coughing.   Stop flovent and for cough try tessalon 200 mg every 6 hours as needed   If cough / wheezing getting worse start on symbicort 80 Take 2 puffs first thing in am and then another 2 puffs about 12 hours later.    PFTs next available  - don't use inhalers that day   Please remember to go to the lab department   for your tests - we will call you with the results when they are available.

## 2021-09-09 LAB — ALPHA-1-ANTITRYPSIN PHENOTYP: A-1 Antitrypsin, Ser: 156 mg/dL (ref 101–187)

## 2021-09-10 ENCOUNTER — Other Ambulatory Visit: Payer: Self-pay | Admitting: Otolaryngology

## 2021-09-10 ENCOUNTER — Ambulatory Visit
Admission: RE | Admit: 2021-09-10 | Discharge: 2021-09-10 | Disposition: A | Discharge status: Home / Self Care | Payer: No Typology Code available for payment source | Source: Ambulatory Visit | Attending: Otolaryngology | Admitting: Otolaryngology

## 2021-09-10 DIAGNOSIS — R058 Other specified cough: Secondary | ICD-10-CM | POA: Diagnosis not present

## 2021-09-10 DIAGNOSIS — R059 Cough, unspecified: Secondary | ICD-10-CM | POA: Diagnosis not present

## 2021-09-10 DIAGNOSIS — Y844 Aspiration of fluid as the cause of abnormal reaction of the patient, or of later complication, without mention of misadventure at the time of the procedure: Secondary | ICD-10-CM

## 2021-09-10 DIAGNOSIS — R1310 Dysphagia, unspecified: Secondary | ICD-10-CM | POA: Diagnosis not present

## 2021-09-10 LAB — IGE: IgE (Immunoglobulin E), Serum: 630 IU/mL — ABNORMAL HIGH (ref 6–495)

## 2021-09-10 NOTE — Therapy (Signed)
[facility] [facility] DIAGNOSTIC RADIOLOGY [address] [place], [place], [address] Phone: [phone]   Fax:     Modified Barium Swallow  Patient Details  Name: Kirsten Perez MRN: [id number] Date of Birth: [birth year] No data recorded  Encounter Date: 09/10/2021   End of Session - 09/10/21 1424     Visit Number 1    Number of Visits 1    Date for SLP Re-Evaluation 09/10/21    SLP Start Time 1300    SLP Stop Time  1400    SLP Time Calculation (min) 60 min    Activity Tolerance Patient tolerated treatment well             Past Medical History:  Diagnosis Date   Gastric ulcer    Hyperlipidemia     Past Surgical History:  Procedure Laterality Date   CHOLECYSTECTOMY     ROTATOR CUFF REPAIR Right     There were no vitals filed for this visit.     Subjective: Patient behavior: (alertness, ability to follow instructions, etc.): pleasant, verbal; engaged easily and followed directions. Chief complaint: dysphagia. No Neurological history per chart. Pt denied any difficulty swallowing at meals(foods nor liquids); inconsistent difficulty when initiating swallowing a Pill at night. Pt had no c/o Reflux activity. She stated she eats Regular meals w/ no change in overall intake or appetite or weight.  OF NOTE: Pt endorsed having a dry cough NOT w/ meals/po's. She stated it occurred most often as an irritation then became a harsh cough. The irritation/cough has been ongoing for ~1 year. No fluoro Imaging in chart to indicate Pulmonary decline in past 1 year.  OM Exam: WNL w/ no unilateral oral weakness noted; cough strong.  Dentition: native, adequate     Objective:  Radiological Procedure: A videoflouroscopic evaluation of oral-preparatory, reflex initiation, and pharyngeal phases of the swallow was performed; as well as a screening of the upper esophageal phase.  POSTURE: upright VIEW: lateral COMPENSATORY STRATEGIES: NONE indicated BOLUSES  ADMINISTERED:  Thin Liquid: 2 tsps; 5 cup sips  Nectar-thick Liquid: 1 tsp; 3 cup sips  Honey-thick Liquid: NT  Puree: 1 trial  Mechanical Soft: 1 trial  Barium Tablet w/ Puree: 1x RESULTS OF EVALUATION: ORAL PREPARATORY PHASE: (The lips, tongue, and velum are observed for strength and coordination)       **Overall Severity Rating: WFL.  SWALLOW INITIATION/REFLEX: (The reflex is normal if "triggered" by the time the bolus reached the base of the tongue)  **Overall Severity Rating: [facility].  PHARYNGEAL PHASE: (Pharyngeal function is normal if the bolus shows rapid, smooth, and continuous transit through the pharynx and there is no pharyngeal residue after the swallow)  **Overall Severity Rating: [facility].  LARYNGEAL PENETRATION: (Material entering into the laryngeal inlet/vestibule but not aspirated): NONE ASPIRATION: NONE ESOPHAGEAL PHASE: (Screening of the upper esophagus): no dysmotility of the viewable cervical esophagus noted.    ASSESSMENT: Pt appears to present w/ No oropharyngeal phase dysphagia; No neuromuscular deficits of swallowing. No aspiration or penetration of po trials was noted to occur during this study. She exhibits a functional, safe swallow. Of note, pt denied any difficulty swallowing at meals. She endorsed inconsistent difficulty during Pill swallowing. Soft Tissue of Head/Neck Imaging was completed in 06/2021 -- pt stated ENT explained to her that she had minimal "bowing" of her vocal cords. No decline in Pulmonary status noted on any CXR/Imaging.   During the oral phase, timely bolus management and control of bolus propulsion for A-P  transfer occurred. Oral clearing achieved w/ all trial consistencies. During the pharyngeal phase, Timely pharyngeal swallow initiation noted w/ all trial consistencies. No aspiration or penetration occurred; airway closure appeared timely, tight. No pharyngeal residue remained post swallow indicating adequate laryngeal excursion and pharyngeal  pressure during the swallow.   Discussed results of MBSS, video viewed and questions answered. Recommend f/u w/ Pulmonology for management of cough/upper airway irritation, per pt's c/o of issues(dry cough occurring outside of meals).  PLAN/RECOMMENDATIONS:  A. Diet: Regular w/ Thin liquids. Pills Whole in Puree if pt finds easier for swallowing.  B. Swallowing Precautions: general precautions  C. Recommended consultation to: continue f/u w/ Pulmonology, ENT  D. Therapy recommendations: NONE  E. Results and recommendations were discussed w/ patient; questions answered/video viewed together.        Dysphagia, unspecified type  Productive cough - Plan: DG SWALLOW FUNC OP MEDICARE SPEECH PATH, DG SWALLOW FUNC: DG SWALLOW FUNC SPEECH PATH        Problem List Patient Active Problem List   Diagnosis Date Noted   Upper airway cough syndrome 09/06/2021   HLD (hyperlipidemia) 03/04/2021   Statin intolerance 03/04/2021   History of gastric ulcer 03/04/2021          Jerilynn Som, MS, CCC-SLP Speech Language Pathologist Rehab Services; [facility] [phone] ([address] Deputy, CCC-SLP 09/10/2021, 2:25 PM  [facility] DIAGNOSTIC RADIOLOGY [address] [place], [place], [address] Phone: [phone]   Fax:     Name: Kirsten Perez MRN: [id number] Date of Birth: [birth year]

## 2021-09-20 DIAGNOSIS — J301 Allergic rhinitis due to pollen: Secondary | ICD-10-CM | POA: Diagnosis not present

## 2021-09-20 DIAGNOSIS — K219 Gastro-esophageal reflux disease without esophagitis: Secondary | ICD-10-CM | POA: Diagnosis not present

## 2021-09-20 DIAGNOSIS — R053 Chronic cough: Secondary | ICD-10-CM | POA: Diagnosis not present

## 2021-09-23 ENCOUNTER — Ambulatory Visit: Payer: No Typology Code available for payment source

## 2021-10-09 ENCOUNTER — Other Ambulatory Visit: Payer: Self-pay | Admitting: Internal Medicine

## 2021-10-09 DIAGNOSIS — Z789 Other specified health status: Secondary | ICD-10-CM

## 2021-10-09 DIAGNOSIS — E785 Hyperlipidemia, unspecified: Secondary | ICD-10-CM

## 2021-10-10 NOTE — Telephone Encounter (Signed)
Requested Prescriptions  Pending Prescriptions Disp Refills  . REPATHA SURECLICK 140 MG/ML SOAJ [Pharmacy Med Name: REPATHA 140 MG/ML SURECLICK[**^R]] 2 mL 3    Sig: INJECT 140MG  UNDER THE SKIN EVERY 14 DAYS     Cardiovascular: PCSK9 Inhibitors Passed - 10/09/2021  9:32 AM      Passed - Valid encounter within last 12 months    Recent Outpatient Visits          3 months ago Blood in stool   [facility], DO   4 months ago Chronic cough   [facility] [place], DO   7 months ago Hyperlipidemia, unspecified hyperlipidemia type   [facility], DO             Passed - Lipid Panel completed within the last 12 months    Cholesterol  Date Value Ref Range Status  07/11/2021 207 (H) <200 mg/dL Final   LDL Cholesterol (Calc)  Date Value Ref Range Status  07/11/2021 120 (H) mg/dL (calc) Final    Comment:    Reference range: <100 . Desirable range <100 mg/dL for primary prevention;   <70 mg/dL for patients with CHD or diabetic patients  with > or = 2 CHD risk factors. 07/13/2021 LDL-C is now calculated using the Martin-Hopkins  calculation, which is a validated novel method providing  better accuracy than the Friedewald equation in the  estimation of LDL-C.  Marland Kitchen et al. Horald Pollen. Lenox Ahr): 2061-2068  (http://education.[facility].com/faq/FAQ164)    HDL  Date Value Ref Range Status  07/11/2021 68 > OR = 50 mg/dL Final   Triglycerides  Date Value Ref Range Status  07/11/2021 88 <150 mg/dL Final

## 2021-10-14 DIAGNOSIS — K219 Gastro-esophageal reflux disease without esophagitis: Secondary | ICD-10-CM | POA: Diagnosis not present

## 2021-10-14 DIAGNOSIS — Z008 Encounter for other general examination: Secondary | ICD-10-CM | POA: Diagnosis not present

## 2021-10-14 DIAGNOSIS — E785 Hyperlipidemia, unspecified: Secondary | ICD-10-CM | POA: Diagnosis not present

## 2021-10-14 DIAGNOSIS — E663 Overweight: Secondary | ICD-10-CM | POA: Diagnosis not present

## 2021-10-14 DIAGNOSIS — T466X5D Adverse effect of antihyperlipidemic and antiarteriosclerotic drugs, subsequent encounter: Secondary | ICD-10-CM | POA: Diagnosis not present

## 2021-10-14 DIAGNOSIS — Z6827 Body mass index (BMI) 27.0-27.9, adult: Secondary | ICD-10-CM | POA: Diagnosis not present

## 2021-10-14 DIAGNOSIS — R32 Unspecified urinary incontinence: Secondary | ICD-10-CM | POA: Diagnosis not present

## 2021-10-14 DIAGNOSIS — F17211 Nicotine dependence, cigarettes, in remission: Secondary | ICD-10-CM | POA: Diagnosis not present

## 2021-10-17 ENCOUNTER — Ambulatory Visit (INDEPENDENT_AMBULATORY_CARE_PROVIDER_SITE_OTHER): Payer: No Typology Code available for payment source | Admitting: Gastroenterology

## 2021-10-17 ENCOUNTER — Encounter: Payer: Self-pay | Admitting: Gastroenterology

## 2021-10-17 VITALS — BP 154/86 | HR 76 | Temp 97.9°F | Ht 63.0 in | Wt 155.0 lb

## 2021-10-17 DIAGNOSIS — R1013 Epigastric pain: Secondary | ICD-10-CM | POA: Diagnosis not present

## 2021-10-17 DIAGNOSIS — K219 Gastro-esophageal reflux disease without esophagitis: Secondary | ICD-10-CM | POA: Diagnosis not present

## 2021-10-17 NOTE — Progress Notes (Signed)
Gastroenterology Consultation  Referring Provider:     [name], DO Primary Care Physician:  [name], DO Primary Gastroenterologist:  Dr. Allen Norris     Reason for Consultation:     History of peptic ulcer        HPI:   Kirsten Perez is a 81 y.o. y/o female referred for consultation & management of history of peptic ulcer by Dr. [name], DO.  This patient comes in today after being seen by her primary care provider with a history of a cough and GERD.  The patient does have a history of a peptic ulcer.  The patient underwent a modified barium swallow with out any abnormalities seen to explain her symptoms.  The patient had been on Protonix and Carafate and this was refilled by her primary care provider at the last meeting.  The patient had reported some acid breakthrough with a report of epigastric discomfort and some burning in her esophagus.  The patient had denied any unexplained weight loss. The patient reports that she has had a chronic cough that has been worked up and is thought to be from either allergies or mold.  The patient said it started after her having moved here from [facility].   Past Medical History:  Diagnosis Date   Gastric ulcer    Hyperlipidemia     Past Surgical History:  Procedure Laterality Date   CHOLECYSTECTOMY     ROTATOR CUFF REPAIR Right     Prior to Admission medications   Medication Sig Start Date End Date Taking? Authorizing Provider  benzonatate (TESSALON) 200 MG capsule Take 1 capsule (200 mg total) by mouth 3 (three) times daily as needed for cough. 09/06/21   Tanda Rockers, MD  budesonide-formoterol (SYMBICORT) 80-4.5 MCG/ACT inhaler Take 2 puffs first thing in am and then another 2 puffs about 12 hours later. 09/06/21   Tanda Rockers, MD  famotidine (PEPCID) 20 MG tablet One after supper 09/06/21   Tanda Rockers, MD  Multiple Vitamin (MULTIVITAMIN) capsule Take 1 capsule by mouth daily.    [provider]   OVER THE COUNTER MEDICATION Take 1 capsule by mouth 2 (two) times daily after a meal. Gavascon    [provider]  pantoprazole (PROTONIX) 40 MG tablet Take 1 tablet (40 mg total) by mouth daily. 06/04/21   [name], DO  REPATHA SURECLICK 416 MG/ML SOAJ INJECT 140MG  UNDER THE SKIN EVERY 14 DAYS 10/10/21   [name], DO  sucralfate (CARAFATE) 1 g tablet Take 1 tablet (1 g total) by mouth 4 (four) times daily -  with meals and at bedtime. 06/04/21 12/01/21  [name], DO    Family History  Problem Relation Age of Onset   Hypertension Mother    Stroke Mother    Heart disease Father      Social History   Tobacco Use   Smoking status: Former    Packs/day: 0.50    Types: Cigarettes    Quit date: 02/27/2014    Years since quitting: 7.6   Smokeless tobacco: Never   Tobacco comments:    Smoked a pack or less a week.  Vaping Use   Vaping Use: Never used  Substance Use Topics   Alcohol use: Never   Drug use: Never    Allergies as of 10/17/2021 - Review Complete 09/06/2021  Allergen Reaction Noted   Statins  03/04/2021   Sulfa antibiotics  03/04/2021    Review of Systems:  All systems reviewed and negative except where noted in HPI.   Physical Exam:  There were no vitals taken for this visit. No LMP recorded. Patient is postmenopausal. General:   Alert,  Well-developed, well-nourished, pleasant and cooperative in NAD Head:  Normocephalic and atraumatic. Eyes:  Sclera clear, no icterus.   Conjunctiva pink. Ears:  Normal auditory acuity. Skin:  Intact without significant lesions or rashes.  No jaundice. Lymph Nodes:  No significant cervical adenopathy. Psych:  Alert and cooperative. Normal mood and affect.  Imaging Studies: No results found.  Assessment and Plan:   Kirsten Perez is a 81 y.o. y/o female who comes in with a history of peptic ulcer disease and states that she had a duodenal ulcer that was recommended by her PCP a year ago to be  followed up with a repeat EGD and when she saw the gastroenterologist he had recommended no further follow-up.  The patient has had no consistent GI symptoms but states that she gets abdominal pain very infrequently but it doubles her over causes her to vomit without any hematemesis and immediately causes diarrhea.  The patient has been told that these are not consistent with peptic ulcer disease and it would be unlikely to have peptic ulcer disease while she is on a daily PPI and Carafate.  The patient and I have discussed the risks and benefits of proceeding with any procedure and what the outcomes may be an without any worry symptoms such as black stools or bloody stools dysphagia or unexplained weight loss we have both come to the agreement that we will not do an upper endoscopy at this time.  The patient has been told to contact me if she has any changes in her health or symptoms.  The patient has been explained the plan agrees with it.    Lucilla Lame, MD. Marval Regal    Note: This dictation was prepared with Dragon dictation along with smaller phrase technology. Any transcriptional errors that result from this process are unintentional.

## 2021-11-12 DIAGNOSIS — J301 Allergic rhinitis due to pollen: Secondary | ICD-10-CM | POA: Diagnosis not present

## 2021-11-20 ENCOUNTER — Other Ambulatory Visit: Payer: Self-pay | Admitting: Internal Medicine

## 2021-11-20 NOTE — Telephone Encounter (Unsigned)
Copied from [place] [id number]. Topic: General - Other >> Nov 20, 2021 10:34 AM Everette C wrote: Reason for CRM: Medication Refill - Medication: famotidine (PEPCID) 20 MG tablet [[id number]]   Has the patient contacted their pharmacy? Yes.  The patient has been directed to contact their PCP   The patient is also requesting the prescription in a 90 day quantity   (Agent: If no, request that the patient contact the pharmacy for the refill. If patient does not wish to contact the pharmacy document the reason why and proceed with request.) (Agent: If yes, when and what did the pharmacy advise?)  Preferred Pharmacy (with phone number or street name): [facility] [id number], [place] [address] Phone: [phone] Fax: [phone] Hours: Not open 24 hours   Has the patient been seen for an appointment in the last year OR does the patient have an upcoming appointment? Yes.    Agent: Please be advised that RX refills may take up to 3 business days. We ask that you follow-up with your pharmacy.

## 2021-11-21 MED ORDER — FAMOTIDINE 20 MG PO TABS: ORAL_TABLET | ORAL | 11 refills | Status: DC

## 2021-11-21 NOTE — Telephone Encounter (Signed)
Requested medication (s) are due for refill today:Yes  Requested medication (s) are on the active medication list: Yes  Last refill:  09/06/21  Future visit scheduled: Yes  Notes to clinic:  Unable to refill per protocol, last refill by another provider.      Requested Prescriptions  Pending Prescriptions Disp Refills   famotidine (PEPCID) 20 MG tablet 30 tablet 11    Sig: One after supper     Gastroenterology:  H2 Antagonists Passed - 11/20/2021 11:21 AM      Passed - Valid encounter within last 12 months    Recent Outpatient Visits           4 months ago Blood in stool   [place], DO   5 months ago Chronic cough   [place], DO   8 months ago Hyperlipidemia, unspecified hyperlipidemia type   [facility] [name], DO       Future Appointments             In 1 month [name], [facility], [facility]

## 2021-11-29 ENCOUNTER — Ambulatory Visit (INDEPENDENT_AMBULATORY_CARE_PROVIDER_SITE_OTHER): Payer: No Typology Code available for payment source | Admitting: Internal Medicine

## 2021-11-29 ENCOUNTER — Ambulatory Visit
Admission: RE | Admit: 2021-11-29 | Discharge: 2021-11-29 | Disposition: A | Discharge status: Home / Self Care | Payer: No Typology Code available for payment source | Source: Ambulatory Visit | Attending: Internal Medicine | Admitting: Internal Medicine

## 2021-11-29 ENCOUNTER — Ambulatory Visit
Admission: RE | Admit: 2021-11-29 | Discharge: 2021-11-29 | Disposition: A | Discharge status: Home / Self Care | Payer: No Typology Code available for payment source | Attending: Internal Medicine | Admitting: Internal Medicine

## 2021-11-29 ENCOUNTER — Encounter: Payer: Self-pay | Admitting: Internal Medicine

## 2021-11-29 VITALS — BP 126/78 | HR 80 | Temp 98.0°F | Resp 16 | Ht 64.0 in | Wt 157.3 lb

## 2021-11-29 DIAGNOSIS — J984 Other disorders of lung: Secondary | ICD-10-CM

## 2021-11-29 DIAGNOSIS — R062 Wheezing: Secondary | ICD-10-CM | POA: Insufficient documentation

## 2021-11-29 DIAGNOSIS — R053 Chronic cough: Secondary | ICD-10-CM | POA: Diagnosis not present

## 2021-11-29 DIAGNOSIS — E785 Hyperlipidemia, unspecified: Secondary | ICD-10-CM

## 2021-11-29 DIAGNOSIS — J9811 Atelectasis: Secondary | ICD-10-CM | POA: Diagnosis not present

## 2021-11-29 DIAGNOSIS — R9389 Abnormal findings on diagnostic imaging of other specified body structures: Secondary | ICD-10-CM

## 2021-11-29 MED ORDER — BENZONATATE 100 MG PO CAPS: 100.0000 mg | ORAL_CAPSULE | Freq: Two times a day (BID) | ORAL | 0 refills | Status: DC | PRN

## 2021-11-29 MED ORDER — PRALUENT 75 MG/ML ~~LOC~~ SOAJ: 75.0000 mg | SUBCUTANEOUS | 3 refills | Status: DC

## 2021-11-29 NOTE — Progress Notes (Signed)
Established Patient Office Visit  Subjective:  Patient ID: Kirsten Perez, female    DOB: 1940-10-04  Age: 81 y.o. MRN: [id number]  CC:  Chief Complaint  Patient presents with   Follow-up   Hyperlipidemia   COPD    HPI Kirsten Perez is a 81 year old female here for follow up on chronic medical conditions.   Chronic cough and new wheezing:  -Dry cough since ulcer in May 2022, occasionally will bring up a thick yellow sputum -Denies post-nasal drip but has worsening wheezing over the last few months. Wheezing will wake up from sleep at night but is worse during the day, states other people have told her her breathing is very loud. -In the past was treated with Medrol dosepack, Pulmicort inhaler and Albuterol PRN but none of these things has helped. -Referral placed to Pulmonology -note reviewed from 09/06/2021.  Tessalon Perles do help with the cough somewhat.  She was trialed on Flovent and Symbicort, both of which have not helped.  PFTs were ordered but these have not been obtained yet. -Was treated with Flonase and Allegra as well for sinusitis/postnasal drip but states these do not help with her wheezing either.  HLD: -Medications: Repatha, had been on multiple statins in the past and couldn't tolerate due to myalgias.  Unfortunately she reached the donut hole and can no longer afford Repatha. -Last lipid panel: Lipid Panel     Component Value Date/Time   CHOL 207 (H) 07/11/2021 1132   TRIG 88 07/11/2021 1132   HDL 68 07/11/2021 1132   CHOLHDL 3.0 07/11/2021 1132   LDLCALC 120 (H) 07/11/2021 1132    Gastric Ulcer: -Currently on Protonix 40 mg in the morning, Carafate 1 g daily  -Was hospitalized after having dark red blood in stool in May, 2022 had an EGD and found to have a bleeding ulcer in [place] -Was evaluated by GI, note reviewed on 10/17/2021.  Health Maintenance: -Blood work up-to-date -Mammogram - does not continue with screening -Colon cancer screening -  does not continue with screening -Immunizations: politely declines flu and PNA vaccine  Past Medical History:  Diagnosis Date   Gastric ulcer    Hyperlipidemia     Past Surgical History:  Procedure Laterality Date   CHOLECYSTECTOMY     ROTATOR CUFF REPAIR Right     Family History  Problem Relation Age of Onset   Hypertension Mother    Stroke Mother    Heart disease Father     Social History   Socioeconomic History   Marital status: Widowed    Spouse name: Not on file   Number of children: Not on file   Years of education: Not on file   Highest education level: Not on file  Occupational History   Not on file  Tobacco Use   Smoking status: Former    Packs/day: 0.50    Types: Cigarettes    Quit date: 02/27/2014    Years since quitting: 7.7   Smokeless tobacco: Never   Tobacco comments:    Smoked a pack or less a week.  Vaping Use   Vaping Use: Never used  Substance and Sexual Activity   Alcohol use: Never   Drug use: Never   Sexual activity: Not Currently  Other Topics Concern   Not on file  Social History Narrative   Not on file   Social Determinants of Health   Financial Resource Strain: Not on file  Food Insecurity: Not on file  Transportation Needs:  Not on file  Physical Activity: Not on file  Stress: Not on file  Social Connections: Not on file  Intimate Partner Violence: Not on file    ROS Review of Systems  Constitutional:  Negative for chills, fatigue and fever.  HENT:  Negative for congestion, postnasal drip, rhinorrhea, sinus pressure, sinus pain and sore throat.   Respiratory:  Positive for cough and wheezing. Negative for shortness of breath.   Cardiovascular:  Negative for chest pain.  Gastrointestinal:  Negative for abdominal pain, constipation, diarrhea, nausea and vomiting.  Neurological:  Negative for dizziness and headaches.    Objective:   Today's Vitals: BP 126/78   Pulse 80   Temp 98 F (36.7 C)   Resp 16   Ht 5\' 4"   (1.626 m)   Wt 157 lb 4.8 oz (71.4 kg)   SpO2 94%   BMI 27.00 kg/m   Physical Exam Constitutional:      Appearance: Normal appearance.  HENT:     Head: Normocephalic and atraumatic.     Mouth/Throat:     Mouth: Mucous membranes are moist.     Pharynx: Oropharynx is clear.  Eyes:     Conjunctiva/sclera: Conjunctivae normal.  Neck:     Comments: Fullness palpated in the proximal left supra clavicular area, no pain to palpation Cardiovascular:     Rate and Rhythm: Normal rate and regular rhythm.  Pulmonary:     Effort: Pulmonary effort is normal.     Breath sounds: Wheezing present.     Comments: Inspiratory and expiratory wheezes auscultated throughout, no rhonchi or rales. Musculoskeletal:     Right lower leg: No edema.     Left lower leg: No edema.  Skin:    General: Skin is warm and dry.  Neurological:     General: No focal deficit present.     Mental Status: She is alert. Mental status is at baseline.  Psychiatric:        Mood and Affect: Mood normal.        Behavior: Behavior normal.     Assessment & Plan:   1. Wheezing/Chronic cough: Wheezing is definitely more prominent today on exam than previous.  Patient states the only thing that helps her cough at all is , which I will refill today.  Reorder PFTs.  She was given a sample of Breo history today.  Patient tried both Symbicort and Flovent, which did not help her symptoms.  She is also using Flonase and Allegra, both of which are not helping either.  Will obtain chest x-ray today, may require high-resolution chest CT.  Follow-up in 1 month or sooner as needed.  - DG Chest 2 View; Future - benzonatate (TESSALON) 100 MG capsule; Take 1 capsule (100 mg total) by mouth 2 (two) times daily as needed for cough.  Dispense: 20 capsule; Refill: 0 - Pulmonary function test; Future  2. Hyperlipidemia, unspecified hyperlipidemia type: Unfortunately she has had the donut hole with the Repatha, will try Praluent 75  mg every 14 days for hyperlipidemia.  - Alirocumab (PRALUENT) 75 MG/ML SOAJ; Inject 75 mg into the skin every 14 (fourteen) days.  Dispense: 2 mL; Refill: 3   Follow-up: Return in about 4 weeks (around 12/27/2021).   14/01/2021, DO

## 2021-11-29 NOTE — Telephone Encounter (Signed)
Please schedule PFT, prefers either tues or Thursday afternoon

## 2021-11-29 NOTE — Patient Instructions (Addendum)
It was great seeing you today!  Plan discussed at today's visit: -Chest x-ray today -Pulmonology function tests ordered today -Try new inhaler -Cough medication sent and Praulent (may take a few weeks to do a prior auth)  Follow up in: 1 month   Take care and let us know if you have any questions or concerns prior to your next visit.  Dr. Rosana Berger

## 2021-12-03 NOTE — Addendum Note (Signed)
Addended by: [name] on: 12/03/2021 12:51 PM   Modules accepted: Orders

## 2021-12-10 ENCOUNTER — Ambulatory Visit
Admission: RE | Admit: 2021-12-10 | Discharge: 2021-12-10 | Disposition: A | Discharge status: Home / Self Care | Payer: No Typology Code available for payment source | Source: Ambulatory Visit | Attending: Internal Medicine | Admitting: Internal Medicine

## 2021-12-10 DIAGNOSIS — R053 Chronic cough: Secondary | ICD-10-CM | POA: Diagnosis not present

## 2021-12-10 DIAGNOSIS — R9389 Abnormal findings on diagnostic imaging of other specified body structures: Secondary | ICD-10-CM | POA: Insufficient documentation

## 2021-12-10 DIAGNOSIS — R062 Wheezing: Secondary | ICD-10-CM | POA: Insufficient documentation

## 2021-12-10 DIAGNOSIS — R059 Cough, unspecified: Secondary | ICD-10-CM | POA: Diagnosis not present

## 2021-12-10 DIAGNOSIS — J984 Other disorders of lung: Secondary | ICD-10-CM | POA: Diagnosis not present

## 2021-12-27 ENCOUNTER — Ambulatory Visit (INDEPENDENT_AMBULATORY_CARE_PROVIDER_SITE_OTHER): Payer: No Typology Code available for payment source | Admitting: Internal Medicine

## 2021-12-27 ENCOUNTER — Encounter: Payer: Self-pay | Admitting: Internal Medicine

## 2021-12-27 ENCOUNTER — Telehealth: Payer: Self-pay

## 2021-12-27 VITALS — BP 124/78 | HR 67 | Temp 98.0°F | Resp 18 | Ht 64.0 in | Wt 156.4 lb

## 2021-12-27 DIAGNOSIS — R490 Dysphonia: Secondary | ICD-10-CM | POA: Diagnosis not present

## 2021-12-27 DIAGNOSIS — R062 Wheezing: Secondary | ICD-10-CM

## 2021-12-27 DIAGNOSIS — E785 Hyperlipidemia, unspecified: Secondary | ICD-10-CM | POA: Diagnosis not present

## 2021-12-27 DIAGNOSIS — R053 Chronic cough: Secondary | ICD-10-CM | POA: Diagnosis not present

## 2021-12-27 MED ORDER — BREZTRI AEROSPHERE 160-9-4.8 MCG/ACT IN AERO: 2.0000 | INHALATION_SPRAY | Freq: Two times a day (BID) | RESPIRATORY_TRACT | 11 refills | Status: DC

## 2021-12-27 MED ORDER — PRALUENT 75 MG/ML ~~LOC~~ SOAJ: 75.0000 mg | SUBCUTANEOUS | 3 refills | Status: DC

## 2021-12-27 MED ORDER — FLUTICASONE PROPIONATE 50 MCG/ACT NA SUSP: 2.0000 | Freq: Every day | NASAL | 6 refills | Status: DC

## 2021-12-27 NOTE — Telephone Encounter (Signed)
PFT ordered can you please schedule she can only do any day the week of dec 18 after that it will need to be in [place] any day any time

## 2021-12-27 NOTE — Progress Notes (Signed)
Established Patient Office Visit  Subjective:  Patient ID: Kirsten Perez, female    DOB: 03-Jun-1940  Age: 81 y.o. MRN: [id number]  CC:  Chief Complaint  Patient presents with   Follow-up   Hoarse    HPI [name] is here for follow up on chronic medical conditions and new hoarseness. Hoarse now for 2 weeks. Voice is becoming worse. Wheezing better occasional cough, clear phlegm.   Chronic cough,wheezing and new hoarseness:  -Dry cough since gastric ulcer in May 2022, occasionally will bring up a thick yellow sputum -Denies post-nasal drip but at previous visit had worsening wheezing for months. Wheezing will wake up from sleep at night but is worse during the day. -In the past was treated with Medrol dosepack, Pulmicort inhaler and Albuterol PRN but none of these things has helped.  She was given a sample of Breztri at her last office visit, which she states has helped her wheezing immensely.  PFTs are still pending. -Referral placed to Pulmonology -note reviewed from 09/06/2021.  Tessalon Perles do help with the cough somewhat.  She was trialed on Flovent and Symbicort, both of which have not helped.  PFTs were ordered but these have not been obtained yet. -Was treated with Flonase and Allegra as well for sinusitis/postnasal drip but states these do not help with her symptoms either -Chest x-ray obtained from 11/29/2021 showing low lung volumes with mild bibasilar subsegmental atelectasis with a mild bilateral interstitial prominence noted.  The radiologist noted that an active process including pneumonitis could not be excluded. 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 -New hoarseness started about 2 weeks ago.  Patient denies any sore throat, postnasal drip, difficulty swallowing or painful swallow.  She feels  like as time progresses the hoarseness is getting worse.  She is worried there is something in her throat or neck causing this.  She did have an ultrasound of the soft tissues of her neck on 07/17/2021 that was negative but this was prior to her symptoms.  At the time she was complaining of neck fullness.  HLD: -Medications: Repatha, had been on multiple statins in the past and couldn't tolerate due to myalgias.  Unfortunately she reached the donut hole and can no longer afford Repatha. -Last lipid panel: Lipid Panel     Component Value Date/Time   CHOL 207 (H) 07/11/2021 1132   TRIG 88 07/11/2021 1132   HDL 68 07/11/2021 1132   CHOLHDL 3.0 07/11/2021 1132   LDLCALC 120 (H) 07/11/2021 1132    Gastric Ulcer: -Currently on Protonix 40 mg in the morning, Carafate 1 g daily  -Was hospitalized after having dark red blood in stool in May, 2022 had an EGD and found to have a bleeding ulcer in [place] -Was evaluated by GI, note reviewed on 10/17/2021.  Health Maintenance: -Blood work up-to-date -Mammogram - does not continue with screening -Colon cancer screening - does not continue with screening -Immunizations: politely declines flu and PNA vaccine  Past Medical History:  Diagnosis Date   Gastric ulcer    Hyperlipidemia     Past Surgical History:  Procedure Laterality Date   CHOLECYSTECTOMY     ROTATOR CUFF REPAIR Right     Family History  Problem Relation Age of Onset   Hypertension Mother    Stroke Mother    Heart disease Father  Social History   Socioeconomic History   Marital status: Widowed    Spouse name: Not on file   Number of children: Not on file   Years of education: Not on file   Highest education level: Not on file  Occupational History   Not on file  Tobacco Use   Smoking status: Former    Packs/day: 0.50    Types: Cigarettes    Quit date: 02/27/2014    Years since quitting: 7.8   Smokeless tobacco: Never   Tobacco comments:    Smoked a pack  or less a week.  Vaping Use   Vaping Use: Never used  Substance and Sexual Activity   Alcohol use: Never   Drug use: Never   Sexual activity: Not Currently  Other Topics Concern   Not on file  Social History Narrative   Not on file   Social Determinants of Health   Financial Resource Strain: Not on file  Food Insecurity: Not on file  Transportation Needs: Not on file  Physical Activity: Not on file  Stress: Not on file  Social Connections: Not on file  Intimate Partner Violence: Not on file    ROS Review of Systems  Constitutional:  Negative for chills, fatigue and fever.  HENT:  Negative for congestion, postnasal drip, rhinorrhea, sinus pressure, sinus pain and sore throat.   Respiratory:  Positive for cough and wheezing. Negative for shortness of breath.   Cardiovascular:  Negative for chest pain.  Gastrointestinal:  Negative for abdominal pain, constipation, diarrhea, nausea and vomiting.  Neurological:  Negative for dizziness and headaches.    Objective:   Today's Vitals: BP 124/78   Pulse 67   Temp 98 F (36.7 C)   Resp 18   Ht 5\' 4"  (1.626 m)   Wt 156 lb 6.4 oz (70.9 kg)   SpO2 98%   BMI 26.85 kg/m   Physical Exam Constitutional:      Appearance: Normal appearance.  HENT:     Head: Normocephalic and atraumatic.     Mouth/Throat:     Mouth: Mucous membranes are moist.     Pharynx: Oropharynx is clear.     Comments: Mild postnasal drip present Eyes:     Conjunctiva/sclera: Conjunctivae normal.  Neck:     Comments: Fullness palpated in the proximal left supra clavicular area, no pain to palpation Cardiovascular:     Rate and Rhythm: Normal rate and regular rhythm.  Pulmonary:     Effort: Pulmonary effort is normal.     Breath sounds: Normal breath sounds. No wheezing.  Musculoskeletal:     Right lower leg: No edema.     Left lower leg: No edema.  Skin:    General: Skin is warm and dry.  Neurological:     General: No focal deficit present.      Mental Status: She is alert. Mental status is at baseline.  Psychiatric:        Mood and Affect: Mood normal.        Behavior: Behavior normal.     Assessment & Plan:   1. Hoarseness of voice/Wheezing/Chronic cough: PFTs reordered.  Perez-Fiery is the only thing that seems to be helping with the wheezing, this was reordered today and she was given samples as well.  Wheezing appears to be asthmatic in nature again PFTs ordered.  Chest x-ray and chest CT performed which were negative for interstitial lung disease which showed small airway disease.  Patient complaining of new hoarseness over  the last 2 weeks.  She had been seen by ENT prior which felt her symptoms were mostly allergic.  She does have some mild postnasal drip present today.  Discussed treating with vocal rest, Flonase 2 sprays on each side twice a day and continuing with her antihistamine therapy.  If hoarseness does not improve over the next few weeks, recommend she follow back up with ENT for another vocal cord evaluation.  Follow-up in 1 month for recheck.  - fluticasone (FLONASE) 50 MCG/ACT nasal spray; Place 2 sprays into both nostrils daily.  Dispense: 16 g; Refill: 6 - Pulmonary function test; Future - Budeson-Glycopyrrol-Formoterol (BREZTRI AEROSPHERE) 160-9-4.8 MCG/ACT AERO; Inhale 2 puffs into the lungs 2 (two) times daily.  Dispense: 10.7 g; Refill: 11  2. Hyperlipidemia, unspecified hyperlipidemia type: Stable. Praluent refilled.  - Alirocumab (PRALUENT) 75 MG/ML SOAJ; Inject 75 mg into the skin every 14 (fourteen) days.  Dispense: 2 mL; Refill: 3   Follow-up: Return in 4 weeks (on 01/24/2022) for 1 month medication follow up, awv w/ nurse.   Margarita Mail, DO

## 2021-12-27 NOTE — Patient Instructions (Addendum)
It was great seeing you today!  Plan discussed at today's visit: -PFT's ordered -Breztri ordered, samples given -Continue allergy medication and Flonase 2 sprays on each side twice a day.  -Contact ENT if hoarseness doesn't improve -Praulent resent   Follow up in: 1 month   Take care and let us know if you have any questions or concerns prior to your next visit.  Dr. Caralee Ates

## 2021-12-30 ENCOUNTER — Ambulatory Visit: Payer: No Typology Code available for payment source | Admitting: Internal Medicine

## 2022-01-17 ENCOUNTER — Other Ambulatory Visit: Payer: Self-pay | Admitting: Internal Medicine

## 2022-01-17 DIAGNOSIS — R490 Dysphonia: Secondary | ICD-10-CM | POA: Diagnosis not present

## 2022-01-17 DIAGNOSIS — Z8711 Personal history of peptic ulcer disease: Secondary | ICD-10-CM

## 2022-01-17 DIAGNOSIS — K219 Gastro-esophageal reflux disease without esophagitis: Secondary | ICD-10-CM | POA: Diagnosis not present

## 2022-01-17 DIAGNOSIS — J320 Chronic maxillary sinusitis: Secondary | ICD-10-CM | POA: Diagnosis not present

## 2022-01-17 DIAGNOSIS — J019 Acute sinusitis, unspecified: Secondary | ICD-10-CM | POA: Diagnosis not present

## 2022-01-17 NOTE — Telephone Encounter (Signed)
Requested Prescriptions  Pending Prescriptions Disp Refills   sucralfate (CARAFATE) 1 g tablet [Pharmacy Med Name: SUCRALFATE 1 GM TABLET] 360 tablet 0    Sig: TAKE 1 TABLET 4 TIMES DAILY WITH MEALS AND AT BEDTIME.     Gastroenterology: Antiacids Passed - 01/17/2022  2:45 AM      Passed - Valid encounter within last 12 months    Recent Outpatient Visits           3 weeks ago Hoarseness of voice   [facility] Margarita Mail, DO   1 month ago Wheezing   [facility] Margarita Mail, DO   6 months ago Blood in stool   [facility] Margarita Mail, DO   7 months ago Chronic cough   [facility] Margarita Mail, DO   10 months ago Hyperlipidemia, unspecified hyperlipidemia type   [facility] Margarita Mail, DO       Future Appointments             In 3 weeks Margarita Mail, DO [facility]   In 1 month  [facility]

## 2022-02-12 NOTE — Progress Notes (Signed)
Established Patient Office Visit  Subjective:  Patient ID: Kirsten Perez, female    DOB: 1940/06/12  Age: 82 y.o. MRN: [id number]  CC:  Chief Complaint  Patient presents with   Follow-up   Hoarse    Pt states feeling better almost 2 weeks ago she got her voice back and cough has improved.    HPI Kirsten Perez is here for follow up on chronic medical conditions. Did have an episode of passing out last week after she left the bathroom. Lost consciousness suddenly, fell to the floor. Denies post-ictal confusion. Was out a few seconds. Felt diaphoretic at the time but denies chest pain, shortness of breath. No issues since.   Chronic cough,wheezing:  -Dry cough since gastric ulcer in May 2022, occasionally will bring up a thick yellow sputum -Chest x-ray obtained from 11/29/2021 showing low lung volumes with mild bibasilar subsegmental atelectasis with a mild bilateral interstitial prominence noted.  The radiologist noted that an active process including pneumonitis could not be excluded. 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 -Finished Breztri and Flovent - symptoms have resolved, no cough or wheezing currently    HLD: -Medications: Praulent 75 mg every 2 weeks - insurance no longer covering  -Last lipid panel: Lipid Panel     Component Value Date/Time   CHOL 207 (H) 07/11/2021 1132   TRIG 88 07/11/2021 1132   HDL 68 07/11/2021 1132   CHOLHDL 3.0 07/11/2021 1132   LDLCALC 120 (H) 07/11/2021 1132    Gastric Ulcer: -Currently on Protonix 40 mg in the morning, Pepcid 20 mg, Carafate 1 g daily  -Was hospitalized after having dark red blood in stool in May, 2022 had an EGD and found to have a bleeding ulcer in [place] -Was evaluated by GI, note reviewed on 10/17/2021.  Health Maintenance: -Blood work  up-to-date -Mammogram - does not continue with screening -Colon cancer screening - does not continue with screening -Immunizations: politely declines flu vaccine, will get pneumonia vaccine   Past Medical History:  Diagnosis Date   Gastric ulcer    Hyperlipidemia     Past Surgical History:  Procedure Laterality Date   CHOLECYSTECTOMY     ROTATOR CUFF REPAIR Right     Family History  Problem Relation Age of Onset   Hypertension Mother    Stroke Mother    Heart disease Father     Social History   Socioeconomic History   Marital status: Widowed    Spouse name: Not on file   Number of children: Not on file   Years of education: Not on file   Highest education level: Not on file  Occupational History   Not on file  Tobacco Use   Smoking status: Former    Packs/day: 0.50    Types: Cigarettes    Quit date: 02/27/2014    Years since quitting: 7.9   Smokeless tobacco: Never   Tobacco comments:    Smoked a pack or less a week.  Vaping Use   Vaping Use: Never used  Substance and Sexual Activity   Alcohol use: Never   Drug use: Never   Sexual activity: Not Currently  Other Topics Concern   Not on file  Social History Narrative   Not on file   Social Determinants of Health   Financial Resource Strain: Not on file  Food Insecurity: Not  on file  Transportation Needs: Not on file  Physical Activity: Not on file  Stress: Not on file  Social Connections: Not on file  Intimate Partner Violence: Not on file    ROS Review of Systems  Constitutional:  Negative for chills, fatigue and fever.  HENT:  Negative for congestion, postnasal drip, rhinorrhea, sinus pressure, sinus pain and sore throat.   Respiratory:  Negative for cough, shortness of breath and wheezing.   Cardiovascular:  Negative for chest pain.  Gastrointestinal:  Negative for abdominal pain, constipation, diarrhea, nausea and vomiting.  Neurological:  Negative for dizziness and headaches.    Objective:    Today's Vitals: BP 138/84   Pulse 73   Temp 97.9 F (36.6 C) (Oral)   Resp 16   Ht 5\' 4"  (1.626 m)   Wt 158 lb 9.6 oz (71.9 kg)   SpO2 98%   BMI 27.22 kg/m   Physical Exam Constitutional:      Appearance: Normal appearance.  HENT:     Head: Normocephalic and atraumatic.     Mouth/Throat:     Mouth: Mucous membranes are moist.     Pharynx: Oropharynx is clear.     Comments: Mild postnasal drip present Eyes:     Conjunctiva/sclera: Conjunctivae normal.  Neck:     Comments: Fullness palpated in the proximal left supra clavicular area, no pain to palpation Cardiovascular:     Rate and Rhythm: Normal rate and regular rhythm.  Pulmonary:     Effort: Pulmonary effort is normal.     Breath sounds: Normal breath sounds. No wheezing.  Musculoskeletal:     Right lower leg: No edema.     Left lower leg: No edema.  Skin:    General: Skin is warm and dry.  Neurological:     General: No focal deficit present.     Mental Status: She is alert. Mental status is at baseline.  Psychiatric:        Mood and Affect: Mood normal.        Behavior: Behavior normal.     Assessment & Plan:   1. Hyperlipidemia, unspecified hyperlipidemia type: Praulent no longer covered, switch back to Repatha. Plan to checking fasting labs at follow up.   - Evolocumab (REPATHA SURECLICK) 950 MG/ML SOAJ; Inject 140 mg into the skin every 14 (fourteen) days.  Dispense: 2 mL; Refill: 3  2. History of gastric ulcer: Continue Protonix 40 mg, Pepcid 20 mg and Carafate 1 mg.   3. Orthostatic hypotension: Blood good here today, discussed monitoring at home and standing and walking slowly, staying well hydrated. If she has any more episodes will obtain EKG and holter.   4. Vaccine for streptococcus pneumoniae and influenza: Prevnar 20 administered today.   - Pneumococcal conjugate vaccine 20-valent (Prevnar 20)   Follow-up: Return in about 5 months (around 07/15/2022).   Kirsten Medici, DO

## 2022-02-13 ENCOUNTER — Ambulatory Visit (INDEPENDENT_AMBULATORY_CARE_PROVIDER_SITE_OTHER): Payer: No Typology Code available for payment source | Admitting: Internal Medicine

## 2022-02-13 ENCOUNTER — Encounter: Payer: Self-pay | Admitting: Internal Medicine

## 2022-02-13 VITALS — BP 138/84 | HR 73 | Temp 97.9°F | Resp 16 | Ht 64.0 in | Wt 158.6 lb

## 2022-02-13 DIAGNOSIS — Z8711 Personal history of peptic ulcer disease: Secondary | ICD-10-CM | POA: Diagnosis not present

## 2022-02-13 DIAGNOSIS — I951 Orthostatic hypotension: Secondary | ICD-10-CM

## 2022-02-13 DIAGNOSIS — Z23 Encounter for immunization: Secondary | ICD-10-CM

## 2022-02-13 DIAGNOSIS — E785 Hyperlipidemia, unspecified: Secondary | ICD-10-CM

## 2022-02-13 MED ORDER — REPATHA SURECLICK 140 MG/ML ~~LOC~~ SOAJ: 140.0000 mg | SUBCUTANEOUS | 3 refills | Status: DC

## 2022-02-13 NOTE — Patient Instructions (Addendum)
It was great seeing you today!  Plan discussed at today's visit: -Repatha ordered -Pneumonia vaccine today -Please be fasting for labs next time   Follow up in: 5 months   Take care and let us know if you have any questions or concerns prior to your next visit.  Dr. Rosana Berger

## 2022-03-11 ENCOUNTER — Ambulatory Visit: Payer: No Typology Code available for payment source

## 2022-03-20 ENCOUNTER — Ambulatory Visit: Payer: No Typology Code available for payment source

## 2022-03-20 VITALS — Ht 63.5 in | Wt 157.0 lb

## 2022-03-20 DIAGNOSIS — Z0000 Encounter for general adult medical examination without abnormal findings: Secondary | ICD-10-CM | POA: Diagnosis not present

## 2022-03-20 NOTE — Progress Notes (Signed)
I connected with  Kirsten Perez on 03/20/22 by a audio enabled telemedicine application and verified that I am speaking with the correct person using two identifiers.  Patient Location: Home  Provider Location: Office/Clinic  I discussed the limitations of evaluation and management by telemedicine. The patient expressed understanding and agreed to proceed.  Subjective:   Kirsten Perez is a 82 y.o. female who presents for Medicare Annual (Subsequent) preventive examination.  Review of Systems        Objective:    Today's Vitals   03/20/22 1535  Weight: 157 lb (71.2 kg)  Height: 5' 3.5" (1.613 m)   Body mass index is 27.38 kg/m.     03/20/2022    3:45 PM  Advanced Directives  Does Patient Have a Medical Advance Directive? Yes  Type of Paramedic of [place];Living will  Copy of [place] in Chart? No - copy requested    Current Medications (verified) Outpatient Encounter Medications as of 03/20/2022  Medication Sig   Evolocumab (REPATHA SURECLICK) [id number] MG/ML SOAJ Inject 140 mg into the skin every 14 (fourteen) days.   Multiple Vitamin (MULTIVITAMIN) capsule Take 1 capsule by mouth daily.   pantoprazole (PROTONIX) 40 MG tablet Take 1 tablet (40 mg total) by mouth daily.   sucralfate (CARAFATE) 1 g tablet TAKE 1 TABLET 4 TIMES DAILY WITH MEALS AND AT BEDTIME.   OVER THE COUNTER MEDICATION Take 1 capsule by mouth 2 (two) times daily after a meal. [place]   No facility-administered encounter medications on file as of 03/20/2022.    Allergies (verified) Statins and Sulfa antibiotics   History: Past Medical History:  Diagnosis Date   Gastric ulcer    Hyperlipidemia    Past Surgical History:  Procedure Laterality Date   CHOLECYSTECTOMY     ROTATOR CUFF REPAIR Right    Family History  Problem Relation Age of Onset   Hypertension Mother    Stroke Mother    Heart disease Father    Social History   Socioeconomic History    Marital status: Widowed    Spouse name: Not on file   Number of children: Not on file   Years of education: Not on file   Highest education level: Not on file  Occupational History   Not on file  Tobacco Use   Smoking status: Former    Packs/day: 0.50    Types: Cigarettes    Quit date: 02/27/2014    Years since quitting: 8.0   Smokeless tobacco: Never   Tobacco comments:    Smoked a pack or less a week.  Vaping Use   Vaping Use: Never used  Substance and Sexual Activity   Alcohol use: Never   Drug use: Never   Sexual activity: Not Currently  Other Topics Concern   Not on file  Social History Narrative   Not on file   Social Determinants of Health   Financial Resource Strain: Low Risk  (03/20/2022)   Overall Financial Resource Strain (CARDIA)    Difficulty of Paying Living Expenses: Not hard at all  Food Insecurity: No Food Insecurity (03/20/2022)   Hunger Vital Sign    Worried About Running Out of Food in the Last Year: Never true    Ran Out of Food in the Last Year: Never true  Transportation Needs: No Transportation Needs (03/20/2022)   PRAPARE - Hydrologist (Medical): No    Lack of Transportation (Non-Medical): No  Physical Activity: Insufficiently  Active (03/20/2022)   Exercise Vital Sign    Days of Exercise per Week: 3 days    Minutes of Exercise per Session: 20 min  Stress: No Stress Concern Present (03/20/2022)   [place]    Feeling of Stress : Not at all  Social Connections: Socially Isolated (03/20/2022)   Social Connection and Isolation Panel [NHANES]    Frequency of Communication with Friends and Family: More than three times a week    Frequency of Social Gatherings with Friends and Family: More than three times a week    Attends Religious Services: Never    Marine scientist or Organizations: No    Attends Archivist Meetings: Never    Marital  Status: Widowed    Tobacco Counseling Counseling given: Not Answered Tobacco comments: Smoked a pack or less a week.   Clinical Intake:  Pre-visit preparation completed: Yes  Pain : No/denies pain     BMI - recorded: 27.38 Nutritional Status: BMI 25 -29 Overweight Nutritional Risks: None Diabetes: No  How often do you need to have someone help you when you read instructions, pamphlets, or other written materials from your doctor or pharmacy?: 1 - Never  Diabetic?no  Interpreter Needed?: No  Comments: lives with her son Information entered by :: B.Angelie Kram,LPN   Activities of Daily Living    03/20/2022    3:46 PM 02/13/2022    2:14 PM  In your present state of health, do you have any difficulty performing the following activities:  Hearing? 0 0  Vision? 0 0  Difficulty concentrating or making decisions? 0 0  Walking or climbing stairs? 0 0  Dressing or bathing? 0 0  Doing errands, shopping? 0 0  Preparing Food and eating ? N   Using the Toilet? N   In the past six months, have you accidently leaked urine? N   Do you have problems with loss of bowel control? N   Managing your Medications? N   Managing your Finances? N   Housekeeping or managing your Housekeeping? N     Patient Care Team: [name], DO as PCP - General (Internal Medicine)  Indicate any recent Medical Services you may have received from other than [facility] providers in the past year (date may be approximate).     Assessment:   This is a routine wellness examination for [name].  Hearing/Vision screen Hearing Screening - Comments:: Adequate hearing Vision Screening - Comments:: Adequate vision:wears contacts Has appt in future at [place] issues and exercise activities discussed: Current Exercise Habits: The patient does not participate in regular exercise at present, Exercise limited by: None identified   Goals Addressed   None    Depression Screen    03/20/2022     3:42 PM 02/13/2022    2:14 PM 12/27/2021    1:13 PM 11/29/2021    1:04 PM 07/11/2021   10:15 AM 06/04/2021    1:39 PM 03/04/2021   11:17 AM  PHQ 2/9 Scores  PHQ - 2 Score 0 0 0 0 0 0 0  PHQ- 9 Score 0 0 0 0 0 0 0    Fall Risk    03/20/2022    3:39 PM 02/13/2022    2:14 PM 12/27/2021    1:10 PM 11/29/2021    1:04 PM 07/11/2021   10:12 AM  [place] in the past year? 0 0 0 0 0  Number  falls in past yr: 0 0 0 0 0  Injury with Fall? 0 0 0 0 0  Risk for fall due to : No Fall Risks No Fall Risks     Follow up Education provided;Falls prevention discussed Falls prevention discussed;Education provided;Falls evaluation completed       FALL RISK PREVENTION PERTAINING TO THE HOME:  Any stairs in or around the home? Yes  If so, are there any without handrails? Yes  Home free of loose throw rugs in walkways, pet beds, electrical cords, etc? Yes  Adequate lighting in your home to reduce risk of falls? Yes   ASSISTIVE DEVICES UTILIZED TO PREVENT FALLS:  Life alert? Yes  Use of a cane, walker or w/c? No  Grab bars in the bathroom? Yes  Shower chair or bench in shower? No  Elevated toilet seat or a handicapped toilet? Yes dont use it-dad had   Cognitive Function:        03/20/2022    3:50 PM  6CIT Screen  What Year? 0 points  What month? 0 points  What time? 0 points  Count back from 20 0 points  Months in reverse 0 points  Repeat phrase 0 points  Total Score 0 points    Immunizations Immunization History  Administered Date(s) Administered   MODERNA COVID-19 SARS-COV-2 PEDS BIVALENT BOOSTER 6Y-11Y 03/23/2019, 04/19/2019, 12/17/2019   PNEUMOCOCCAL CONJUGATE-20 02/13/2022    TDAP status: Due, Education has been provided regarding the importance of this vaccine. Advised may receive this vaccine at local pharmacy or Health Dept. Aware to provide a copy of the vaccination record if obtained from local pharmacy or Health Dept. Verbalized acceptance and understanding.  Flu  Vaccine status: Declined, Education has been provided regarding the importance of this vaccine but patient still declined. Advised may receive this vaccine at local pharmacy or Health Dept. Aware to provide a copy of the vaccination record if obtained from local pharmacy or Health Dept. Verbalized acceptance and understanding.  Pneumococcal vaccine status: Up to date  Covid-19 vaccine status: Completed vaccines  Qualifies for Shingles Vaccine? Yes   Zostavax completed no Shingrix Completed?: No.    Education has been provided regarding the importance of this vaccine. Patient has been advised to call insurance company to determine out of pocket expense if they have not yet received this vaccine. Advised may also receive vaccine at local pharmacy or Health Dept. Verbalized acceptance and understanding.  Screening Tests Health Maintenance  Topic Date Due   Zoster Vaccines- Shingrix (1 of 2) Never done   DEXA SCAN  Never done   COVID-19 Vaccine (4 - 2023-24 season) 09/27/2021   INFLUENZA VACCINE  04/27/2022 (Originally 08/27/2021)   Medicare Annual Wellness (AWV)  03/21/2023   Pneumonia Vaccine 64+ Years old  Completed   HPV VACCINES  Aged Out   DTaP/Tdap/Td  Discontinued    Health Maintenance  Health Maintenance Due  Topic Date Due   Zoster Vaccines- Shingrix (1 of 2) Never done   DEXA SCAN  Never done   COVID-19 Vaccine (4 - 2023-24 season) 09/27/2021    Colorectal cancer screening: No longer required.   Mammogram status: No longer required due to age.  Bone Density status: Completed yes. Results reflect: Bone density results: NORMAL. Repeat every 5 years. Pt declines  Lung Cancer Screening: (Low Dose CT Chest recommended if Age 78-80 years, 30 pack-year currently smoking OR have quit w/in 15years.) does not qualify.   Lung Cancer Screening Referral: no  Additional Screening:  Hepatitis C Screening: does not qualify; Completed no  Vision Screening: Recommended annual  ophthalmology exams for early detection of glaucoma and other disorders of the eye. Is the patient up to date with their annual eye exam?  Yes  Who is the provider or what is the name of the office in which the patient attends annual eye exams? [facility] -making appt was [facility] If pt is not established with a provider, would they like to be referred to a provider to establish care? No .   Dental Screening: Recommended annual dental exams for proper oral hygiene  Community Resource Referral / Chronic Care Management: CRR required this visit?  No   CCM required this visit?  No      Plan:     I have personally reviewed and noted the following in the patient's chart:   Medical and social history Use of alcohol, tobacco or illicit drugs  Current medications and supplements including opioid prescriptions. Patient is not currently taking opioid prescriptions. Functional ability and status Nutritional status Physical activity Advanced directives List of other physicians Hospitalizations, surgeries, and ER visits in previous 12 months Vitals Screenings to include cognitive, depression, and falls Referrals and appointments  In addition, I have reviewed and discussed with patient certain preventive protocols, quality metrics, and best practice recommendations. A written personalized care plan for preventive services as well as general preventive health recommendations were provided to patient.     Roger Shelter, LPN   [id number]   Nurse Notes: pt is doing well:lives with son, who does much of home management. She states has no financial burdens as he pays for it all. She has no concerns or questions.

## 2022-03-20 NOTE — Patient Instructions (Signed)
[name] , Thank you for taking time to come for your Medicare Wellness Visit. I appreciate your ongoing commitment to your health goals. Please review the following plan we discussed and let me know if I can assist you in the future.   These are the goals we discussed:  Goals   None     This is a list of the screening recommended for you and due dates:  Health Maintenance  Topic Date Due   Zoster (Shingles) Vaccine (1 of 2) Never done   DEXA scan (bone density measurement)  Never done   COVID-19 Vaccine (4 - 2023-24 season) 09/27/2021   Flu Shot  04/27/2022*   Medicare Annual Wellness Visit  03/21/2023   Pneumonia Vaccine  Completed   HPV Vaccine  Aged Out   DTaP/Tdap/Td vaccine  Discontinued  *Topic was postponed. The date shown is not the original due date.    Advanced directives: yes  Conditions/risks identified: none  Next appointment: Follow up in one year for your annual wellness visit 03/26/2023 @3pm$  telephone   Preventive Care 65 Years and Older, Female Preventive care refers to lifestyle choices and visits with your health care provider that can promote health and wellness. What does preventive care include? A yearly physical exam. This is also called an annual well check. Dental exams once or twice a year. Routine eye exams. Ask your health care provider how often you should have your eyes checked. Personal lifestyle choices, including: Daily care of your teeth and gums. Regular physical activity. Eating a healthy diet. Avoiding tobacco and drug use. Limiting alcohol use. Practicing safe sex. Taking low-dose aspirin every day. Taking vitamin and mineral supplements as recommended by your health care provider. What happens during an annual well check? The services and screenings done by your health care provider during your annual well check will depend on your age, overall health, lifestyle risk factors, and family history of disease. Counseling  Your health  care provider may ask you questions about your: Alcohol use. Tobacco use. Drug use. Emotional well-being. Home and relationship well-being. Sexual activity. Eating habits. History of falls. Memory and ability to understand (cognition). Work and work Statistician. Reproductive health. Screening  You may have the following tests or measurements: Height, weight, and BMI. Blood pressure. Lipid and cholesterol levels. These may be checked every 5 years, or more frequently if you are over 59 years old. Skin check. Lung cancer screening. You may have this screening every year starting at age 68 if you have a 30-pack-year history of smoking and currently smoke or have quit within the past 15 years. Fecal occult blood test (FOBT) of the stool. You may have this test every year starting at age 5. Flexible sigmoidoscopy or colonoscopy. You may have a sigmoidoscopy every 5 years or a colonoscopy every 10 years starting at age 18. Hepatitis C blood test. Hepatitis B blood test. Sexually transmitted disease (STD) testing. Diabetes screening. This is done by checking your blood sugar (glucose) after you have not eaten for a while (fasting). You may have this done every 1-3 years. Bone density scan. This is done to screen for osteoporosis. You may have this done starting at age 85. Mammogram. This may be done every 1-2 years. Talk to your health care provider about how often you should have regular mammograms. Talk with your health care provider about your test results, treatment options, and if necessary, the need for more tests. Vaccines  Your health care provider may recommend certain  vaccines, such as: Influenza vaccine. This is recommended every year. Tetanus, diphtheria, and acellular pertussis (Tdap, Td) vaccine. You may need a Td booster every 10 years. Zoster vaccine. You may need this after age 55. Pneumococcal 13-valent conjugate (PCV13) vaccine. One dose is recommended after age  23. Pneumococcal polysaccharide (PPSV23) vaccine. One dose is recommended after age 73. Talk to your health care provider about which screenings and vaccines you need and how often you need them. This information is not intended to replace advice given to you by your health care provider. Make sure you discuss any questions you have with your health care provider. Document Released: 02/09/2015 Document Revised: 10/03/2015 Document Reviewed: 11/14/2014 Elsevier Interactive Patient Education  2017 [employer] Prevention in the Home Falls can cause injuries. They can happen to people of all ages. There are many things you can do to make your home safe and to help prevent falls. What can I do on the outside of my home? Regularly fix the edges of walkways and driveways and fix any cracks. Remove anything that might make you trip as you walk through a door, such as a raised step or threshold. Trim any bushes or trees on the path to your home. Use bright outdoor lighting. Clear any walking paths of anything that might make someone trip, such as rocks or tools. Regularly check to see if handrails are loose or broken. Make sure that both sides of any steps have handrails. Any raised decks and porches should have guardrails on the edges. Have any leaves, snow, or ice cleared regularly. Use sand or salt on walking paths during winter. Clean up any spills in your garage right away. This includes oil or grease spills. What can I do in the bathroom? Use night lights. Install grab bars by the toilet and in the tub and shower. Do not use towel bars as grab bars. Use non-skid mats or decals in the tub or shower. If you need to sit down in the shower, use a plastic, non-slip stool. Keep the floor dry. Clean up any water that spills on the floor as soon as it happens. Remove soap buildup in the tub or shower regularly. Attach bath mats securely with double-sided non-slip rug tape. Do not have throw  rugs and other things on the floor that can make you trip. What can I do in the bedroom? Use night lights. Make sure that you have a light by your bed that is easy to reach. Do not use any sheets or blankets that are too big for your bed. They should not hang down onto the floor. Have a firm chair that has side arms. You can use this for support while you get dressed. Do not have throw rugs and other things on the floor that can make you trip. What can I do in the kitchen? Clean up any spills right away. Avoid walking on wet floors. Keep items that you use a lot in easy-to-reach places. If you need to reach something above you, use a strong step stool that has a grab bar. Keep electrical cords out of the way. Do not use floor polish or wax that makes floors slippery. If you must use wax, use non-skid floor wax. Do not have throw rugs and other things on the floor that can make you trip. What can I do with my stairs? Do not leave any items on the stairs. Make sure that there are handrails on both sides of the stairs  and use them. Fix handrails that are broken or loose. Make sure that handrails are as long as the stairways. Check any carpeting to make sure that it is firmly attached to the stairs. Fix any carpet that is loose or worn. Avoid having throw rugs at the top or bottom of the stairs. If you do have throw rugs, attach them to the floor with carpet tape. Make sure that you have a light switch at the top of the stairs and the bottom of the stairs. If you do not have them, ask someone to add them for you. What else can I do to help prevent falls? Wear shoes that: Do not have high heels. Have rubber bottoms. Are comfortable and fit you well. Are closed at the toe. Do not wear sandals. If you use a stepladder: Make sure that it is fully opened. Do not climb a closed stepladder. Make sure that both sides of the stepladder are locked into place. Ask someone to hold it for you, if  possible. Clearly mark and make sure that you can see: Any grab bars or handrails. First and last steps. Where the edge of each step is. Use tools that help you move around (mobility aids) if they are needed. These include: Canes. Walkers. Scooters. Crutches. Turn on the lights when you go into a dark area. Replace any light bulbs as soon as they burn out. Set up your furniture so you have a clear path. Avoid moving your furniture around. If any of your floors are uneven, fix them. If there are any pets around you, be aware of where they are. Review your medicines with your doctor. Some medicines can make you feel dizzy. This can increase your chance of falling. Ask your doctor what other things that you can do to help prevent falls. This information is not intended to replace advice given to you by your health care provider. Make sure you discuss any questions you have with your health care provider. Document Released: 11/09/2008 Document Revised: 06/21/2015 Document Reviewed: 02/17/2014 Elsevier Interactive Patient Education  2017 [employer].

## 2022-03-25 DIAGNOSIS — H5213 Myopia, bilateral: Secondary | ICD-10-CM | POA: Diagnosis not present

## 2022-03-26 DIAGNOSIS — L2089 Other atopic dermatitis: Secondary | ICD-10-CM | POA: Diagnosis not present

## 2022-03-26 DIAGNOSIS — X32XXXA Exposure to sunlight, initial encounter: Secondary | ICD-10-CM | POA: Diagnosis not present

## 2022-03-26 DIAGNOSIS — C44619 Basal cell carcinoma of skin of left upper limb, including shoulder: Secondary | ICD-10-CM | POA: Diagnosis not present

## 2022-03-26 DIAGNOSIS — L7211 Pilar cyst: Secondary | ICD-10-CM | POA: Diagnosis not present

## 2022-03-26 DIAGNOSIS — D485 Neoplasm of uncertain behavior of skin: Secondary | ICD-10-CM | POA: Diagnosis not present

## 2022-03-26 DIAGNOSIS — L570 Actinic keratosis: Secondary | ICD-10-CM | POA: Diagnosis not present

## 2022-03-31 DIAGNOSIS — Z0100 Encounter for examination of eyes and vision without abnormal findings: Secondary | ICD-10-CM | POA: Diagnosis not present

## 2022-04-15 ENCOUNTER — Telehealth: Payer: Self-pay | Admitting: Internal Medicine

## 2022-04-15 NOTE — Telephone Encounter (Unsigned)
Copied from [place] [id number]. Topic: General - Inquiry >> Apr 15, 2022  3:08 PM Rosanne Ashing P wrote: Reason for CRM: Pt called saying the pharmacy told her the next time [place] she will need a PA.  She has two more injections left before she needs a refill.  # [phone]

## 2022-04-16 NOTE — Telephone Encounter (Signed)
Prior auth started

## 2022-05-15 DIAGNOSIS — C44619 Basal cell carcinoma of skin of left upper limb, including shoulder: Secondary | ICD-10-CM | POA: Diagnosis not present

## 2022-05-28 ENCOUNTER — Other Ambulatory Visit: Payer: Self-pay | Admitting: Internal Medicine

## 2022-05-28 DIAGNOSIS — Z8711 Personal history of peptic ulcer disease: Secondary | ICD-10-CM

## 2022-05-28 NOTE — Telephone Encounter (Signed)
Medication Refill - Medication: sucralfate (CARAFATE) 1 g tablet   Has the patient contacted their pharmacy? No.  Preferred Pharmacy (with phone number or street name):  [id number] [place], [place] - [address] Phone: [phone]  Fax: [phone]     Has the patient been seen for an appointment in the last year OR does the patient have an upcoming appointment? No.  Agent: Please be advised that RX refills may take up to 3 business days. We ask that you follow-up with your pharmacy.

## 2022-05-29 MED ORDER — SUCRALFATE 1 G PO TABS: ORAL_TABLET | ORAL | 0 refills | Status: DC

## 2022-05-29 NOTE — Telephone Encounter (Signed)
Requested Prescriptions  Pending Prescriptions Disp Refills   sucralfate (CARAFATE) 1 g tablet 360 tablet 0    Sig: TAKE 1 TABLET 4 TIMES DAILY WITH MEALS AND AT BEDTIME.     Gastroenterology: Antiacids Passed - 05/28/2022 11:38 AM      Passed - Valid encounter within last 12 months    Recent Outpatient Visits           3 months ago Hyperlipidemia, unspecified hyperlipidemia type   [facility] Margarita Mail, DO   5 months ago Hoarseness of voice   [name] Margarita Mail, DO   6 months ago Wheezing   [facility] Margarita Mail, DO   10 months ago Blood in stool   [facility] Margarita Mail, DO   11 months ago Chronic cough   [facility] Margarita Mail, DO       Future Appointments             In 1 month Margarita Mail, DO [facility]

## 2022-06-04 DIAGNOSIS — L538 Other specified erythematous conditions: Secondary | ICD-10-CM | POA: Diagnosis not present

## 2022-06-04 DIAGNOSIS — D485 Neoplasm of uncertain behavior of skin: Secondary | ICD-10-CM | POA: Diagnosis not present

## 2022-06-04 DIAGNOSIS — R208 Other disturbances of skin sensation: Secondary | ICD-10-CM | POA: Diagnosis not present

## 2022-06-04 DIAGNOSIS — L7211 Pilar cyst: Secondary | ICD-10-CM | POA: Diagnosis not present

## 2022-06-09 ENCOUNTER — Other Ambulatory Visit: Payer: Self-pay | Admitting: Internal Medicine

## 2022-06-09 ENCOUNTER — Telehealth: Payer: Self-pay | Admitting: Internal Medicine

## 2022-06-09 DIAGNOSIS — E785 Hyperlipidemia, unspecified: Secondary | ICD-10-CM

## 2022-06-09 DIAGNOSIS — R053 Chronic cough: Secondary | ICD-10-CM

## 2022-06-09 MED ORDER — BENZONATATE 100 MG PO CAPS: 100.0000 mg | ORAL_CAPSULE | Freq: Two times a day (BID) | ORAL | 1 refills | Status: DC | PRN

## 2022-06-09 NOTE — Telephone Encounter (Signed)
The patient called in requesting refill on a medication she got a while ago. She was prescribed benzonatate for her chronic cough and it really helps. She is going out of town next week and could really use this before then. She uses   [facility] [address] [place] - [place], [place] - [address] AT [address] Phone: [phone]  Fax: [phone]     Please assist patient further as she has a follow up appt with her provider next month

## 2022-06-10 NOTE — Telephone Encounter (Signed)
Requested Prescriptions  Pending Prescriptions Disp Refills   REPATHA SURECLICK 140 MG/ML SOAJ [Pharmacy Med Name: REPATHA 140 MG/ML SURECLICK[**^R]] 2 mL 3    Sig: INJECT THE CONTENTS OF ONE PEN UNDER THE SKIN EVERY 2 WEEKS     Cardiovascular: PCSK9 Inhibitors Passed - 06/09/2022  3:27 PM      Passed - Valid encounter within last 12 months    Recent Outpatient Visits           3 months ago Hyperlipidemia, unspecified hyperlipidemia type   [facility] Margarita Mail, DO   5 months ago Hoarseness of voice   [facility] Margarita Mail, DO   6 months ago Wheezing   [facility] Margarita Mail, DO   11 months ago Blood in stool   [facility] Margarita Mail, DO   1 year ago Chronic cough   [facility] Margarita Mail, DO       Future Appointments             In 3 weeks Margarita Mail, DO [facility], [facility]            Passed - Lipid Panel completed within the last 12 months    Cholesterol  Date Value Ref Range Status  07/11/2021 207 (H) <200 mg/dL Final   LDL Cholesterol (Calc)  Date Value Ref Range Status  07/11/2021 120 (H) mg/dL (calc) Final    Comment:    Reference range: <100 . Desirable range <100 mg/dL for primary prevention;   <70 mg/dL for patients with CHD or diabetic patients  with > or = 2 CHD risk factors. Marland Kitchen LDL-C is now calculated using the Martin-Hopkins  calculation, which is a validated novel method providing  better accuracy than the Friedewald equation in the  estimation of LDL-C.  Horald Pollen et al. Lenox Ahr. 1610;960(45): 2061-2068  (http://education.[facility].com/faq/FAQ164)    HDL  Date Value Ref Range Status  07/11/2021 68 > OR = 50 mg/dL Final   Triglycerides  Date Value Ref Range Status  07/11/2021 88 <150 mg/dL Final

## 2022-07-01 NOTE — Progress Notes (Unsigned)
Established Patient Office Visit  Subjective:  Patient ID: Kirsten Perez, female    DOB: 06-25-1940  Age: 82 y.o. MRN: [id number]  CC:  No chief complaint on file.   HPI Kirsten Perez is here for follow up on chronic medical conditions. Did have an episode of passing out last week after she left the bathroom. Lost consciousness suddenly, fell to the floor. Denies post-ictal confusion. Was out a few seconds. Felt diaphoretic at the time but denies chest pain, shortness of breath. No issues since.   Chronic cough,wheezing:  -Dry cough since gastric ulcer in May 2022, occasionally will bring up a thick yellow sputum -Chest x-ray obtained from 11/29/2021 showing low lung volumes with mild bibasilar subsegmental atelectasis with a mild bilateral interstitial prominence noted.  The radiologist noted that an active process including pneumonitis could not be excluded. 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 -Finished Breztri and Flovent - symptoms have resolved, no cough or wheezing currently    HLD: -Medications: Praulent 75 mg every 2 weeks - insurance no longer covering  -Last lipid panel: Lipid Panel     Component Value Date/Time   CHOL 207 (H) 07/11/2021 1132   TRIG 88 07/11/2021 1132   HDL 68 07/11/2021 1132   CHOLHDL 3.0 07/11/2021 1132   LDLCALC 120 (H) 07/11/2021 1132    Gastric Ulcer: -Currently on Protonix 40 mg in the morning, Pepcid 20 mg, Carafate 1 g daily  -Was hospitalized after having dark red blood in stool in May, 2022 had an EGD and found to have a bleeding ulcer in [place] -Was evaluated by GI, note reviewed on 10/17/2021.  Health Maintenance: -Blood work up-to-date -Mammogram - does not continue with screening -Colon cancer screening - does not continue with screening -Immunizations:  politely declines flu vaccine, will get pneumonia vaccine   Past Medical History:  Diagnosis Date   Gastric ulcer    Hyperlipidemia     Past Surgical History:  Procedure Laterality Date   CHOLECYSTECTOMY     ROTATOR CUFF REPAIR Right     Family History  Problem Relation Age of Onset   Hypertension Mother    Stroke Mother    Heart disease Father     Social History   Socioeconomic History   Marital status: Widowed    Spouse name: Not on file   Number of children: Not on file   Years of education: Not on file   Highest education level: Not on file  Occupational History   Not on file  Tobacco Use   Smoking status: Former    Packs/day: .5    Types: Cigarettes    Quit date: 02/27/2014    Years since quitting: 8.3   Smokeless tobacco: Never   Tobacco comments:    Smoked a pack or less a week.  Vaping Use   Vaping Use: Never used  Substance and Sexual Activity   Alcohol use: Never   Drug use: Never   Sexual activity: Not Currently  Other Topics Concern   Not on file  Social History Narrative   Not on file   Social Determinants of Health   Financial Resource Strain: Low Risk  (03/20/2022)   Overall Financial Resource Strain (CARDIA)    Difficulty of Paying Living Expenses: Not hard at all  Food Insecurity: No Food Insecurity (03/20/2022)   Hunger Vital Sign  Worried About Programme researcher, broadcasting/film/video in the Last Year: Never true    Ran Out of Food in the Last Year: Never true  Transportation Needs: No Transportation Needs (03/20/2022)   PRAPARE - Administrator, Civil Service (Medical): No    Lack of Transportation (Non-Medical): No  Physical Activity: Insufficiently Active (03/20/2022)   Exercise Vital Sign    Days of Exercise per Week: 3 days    Minutes of Exercise per Session: 20 min  Stress: No Stress Concern Present (03/20/2022)   [employer] of Occupational Health - Occupational Stress Questionnaire    Feeling of Stress : Not at all  Social  Connections: Socially Isolated (03/20/2022)   Social Connection and Isolation Panel [NHANES]    Frequency of Communication with Friends and Family: More than three times a week    Frequency of Social Gatherings with Friends and Family: More than three times a week    Attends Religious Services: Never    Database administrator or Organizations: No    Attends Banker Meetings: Never    Marital Status: Widowed  Intimate Partner Violence: Not At Risk (03/20/2022)   Humiliation, Afraid, Rape, and Kick questionnaire    Fear of Current or Ex-Partner: No    Emotionally Abused: No    Physically Abused: No    Sexually Abused: No    ROS Review of Systems  Constitutional:  Negative for chills, fatigue and fever.  HENT:  Negative for congestion, postnasal drip, rhinorrhea, sinus pressure, sinus pain and sore throat.   Respiratory:  Negative for cough, shortness of breath and wheezing.   Cardiovascular:  Negative for chest pain.  Gastrointestinal:  Negative for abdominal pain, constipation, diarrhea, nausea and vomiting.  Neurological:  Negative for dizziness and headaches.    Objective:   Today's Vitals: There were no vitals taken for this visit.  Physical Exam Constitutional:      Appearance: Normal appearance.  HENT:     Head: Normocephalic and atraumatic.     Mouth/Throat:     Mouth: Mucous membranes are moist.     Pharynx: Oropharynx is clear.     Comments: Mild postnasal drip present Eyes:     Conjunctiva/sclera: Conjunctivae normal.  Neck:     Comments: Fullness palpated in the proximal left supra clavicular area, no pain to palpation Cardiovascular:     Rate and Rhythm: Normal rate and regular rhythm.  Pulmonary:     Effort: Pulmonary effort is normal.     Breath sounds: Normal breath sounds. No wheezing.  Musculoskeletal:     Right lower leg: No edema.     Left lower leg: No edema.  Skin:    General: Skin is warm and dry.  Neurological:     General: No  focal deficit present.     Mental Status: She is alert. Mental status is at baseline.  Psychiatric:        Mood and Affect: Mood normal.        Behavior: Behavior normal.     Assessment & Plan:   1. Hyperlipidemia, unspecified hyperlipidemia type: Praulent no longer covered, switch back to Repatha. Plan to checking fasting labs at follow up.   - Evolocumab (REPATHA SURECLICK) 140 MG/ML SOAJ; Inject 140 mg into the skin every 14 (fourteen) days.  Dispense: 2 mL; Refill: 3  2. History of gastric ulcer: Continue Protonix 40 mg, Pepcid 20 mg and Carafate 1 mg.   3. Orthostatic hypotension: Blood good here  today, discussed monitoring at home and standing and walking slowly, staying well hydrated. If she has any more episodes will obtain EKG and holter.   4. Vaccine for streptococcus pneumoniae and influenza: Prevnar 20 administered today.   - Pneumococcal conjugate vaccine 20-valent (Prevnar 20)   Follow-up: No follow-ups on file.   Margarita Mail, DO

## 2022-07-03 ENCOUNTER — Ambulatory Visit (INDEPENDENT_AMBULATORY_CARE_PROVIDER_SITE_OTHER): Payer: No Typology Code available for payment source | Admitting: Internal Medicine

## 2022-07-03 ENCOUNTER — Other Ambulatory Visit: Payer: Self-pay

## 2022-07-03 ENCOUNTER — Encounter: Payer: Self-pay | Admitting: Internal Medicine

## 2022-07-03 VITALS — BP 132/78 | HR 68 | Temp 98.6°F | Resp 16 | Ht 64.0 in | Wt 157.5 lb

## 2022-07-03 DIAGNOSIS — E785 Hyperlipidemia, unspecified: Secondary | ICD-10-CM

## 2022-07-03 DIAGNOSIS — R062 Wheezing: Secondary | ICD-10-CM

## 2022-07-03 DIAGNOSIS — Z789 Other specified health status: Secondary | ICD-10-CM

## 2022-07-03 DIAGNOSIS — R7309 Other abnormal glucose: Secondary | ICD-10-CM

## 2022-07-03 DIAGNOSIS — Z8711 Personal history of peptic ulcer disease: Secondary | ICD-10-CM

## 2022-07-03 NOTE — Patient Instructions (Addendum)
It was great seeing you today!  Plan discussed at today's visit: -Blood work ordered today, results will be uploaded to MyChart.  -Start an oral anti-histamine daily for allergies - can try Zyrtec, Xyzal or Allegra -Continue to use Breztri inhaler as needed for shortness of breath or wheezing   Follow up in: 6 months   Take care and let us know if you have any questions or concerns prior to your next visit.  Dr. Caralee Ates

## 2022-07-04 LAB — HEMOGLOBIN A1C
Hgb A1c MFr Bld: 6.4 % of total Hgb — ABNORMAL HIGH (ref <5.7)
Mean Plasma Glucose: 137 mg/dL
eAG (mmol/L): 7.6 mmol/L

## 2022-07-04 LAB — CBC WITH DIFFERENTIAL/PLATELET
Absolute Monocytes: 544 cells/uL (ref 200–950)
Basophils Absolute: 61 cells/uL (ref 0–200)
Basophils Relative: 0.9 %
Eosinophils Absolute: 680 cells/uL — ABNORMAL HIGH (ref 15–500)
Eosinophils Relative: 10 %
HCT: 44.8 % (ref 35.0–45.0)
Hemoglobin: 14.6 g/dL (ref 11.7–15.5)
Lymphs Abs: 2115 cells/uL (ref 850–3900)
MCH: 28.7 pg (ref 27.0–33.0)
MCHC: 32.6 g/dL (ref 32.0–36.0)
MCV: 88.2 fL (ref 80.0–100.0)
MPV: 10.3 fL (ref 7.5–12.5)
Monocytes Relative: 8 %
Neutro Abs: 3400 cells/uL (ref 1500–7800)
Neutrophils Relative %: 50 %
Platelets: 259 10*3/uL (ref 140–400)
RBC: 5.08 10*6/uL (ref 3.80–5.10)
RDW: 14.2 % (ref 11.0–15.0)
Total Lymphocyte: 31.1 %
WBC: 6.8 10*3/uL (ref 3.8–10.8)

## 2022-07-04 LAB — COMPLETE METABOLIC PANEL WITH GFR
AG Ratio: 1.3 (calc) (ref 1.0–2.5)
ALT: 11 U/L (ref 6–29)
AST: 14 U/L (ref 10–35)
Albumin: 4.1 g/dL (ref 3.6–5.1)
Alkaline phosphatase (APISO): 69 U/L (ref 37–153)
BUN: 14 mg/dL (ref 7–25)
CO2: 28 mmol/L (ref 20–32)
Calcium: 9.7 mg/dL (ref 8.6–10.4)
Chloride: 104 mmol/L (ref 98–110)
Creat: 0.73 mg/dL (ref 0.60–0.95)
Globulin: 3.1 g/dL (calc) (ref 1.9–3.7)
Glucose, Bld: 167 mg/dL — ABNORMAL HIGH (ref 65–99)
Potassium: 3.9 mmol/L (ref 3.5–5.3)
Sodium: 141 mmol/L (ref 135–146)
Total Bilirubin: 0.6 mg/dL (ref 0.2–1.2)
Total Protein: 7.2 g/dL (ref 6.1–8.1)
eGFR: 82 mL/min/{1.73_m2} (ref >=60)

## 2022-07-04 LAB — LIPID PANEL
Cholesterol: 191 mg/dL (ref <200)
HDL: 61 mg/dL (ref >=50)
LDL Cholesterol (Calc): 101 mg/dL (calc) — ABNORMAL HIGH
Non-HDL Cholesterol (Calc): 130 mg/dL (calc) — ABNORMAL HIGH (ref <130)
Total CHOL/HDL Ratio: 3.1 (calc) (ref <5.0)
Triglycerides: 174 mg/dL — ABNORMAL HIGH (ref <150)

## 2022-08-18 ENCOUNTER — Other Ambulatory Visit: Payer: Self-pay | Admitting: Internal Medicine

## 2022-08-18 DIAGNOSIS — E785 Hyperlipidemia, unspecified: Secondary | ICD-10-CM

## 2022-08-19 NOTE — Telephone Encounter (Signed)
Requested Prescriptions  Pending Prescriptions Disp Refills   Evolocumab (REPATHA SURECLICK) 140 MG/ML SOAJ [Pharmacy Med Name: REPATHA 140 MG/ML SURECLICK[**^R]] 2 mL 2    Sig: INJECT 140 MG UNDER THE SKIN EVERY TWO WEEKS INTO THE THIGH, STOMACH, OR UPPER ARM     Cardiovascular: PCSK9 Inhibitors Passed - 08/18/2022 10:48 AM      Passed - Valid encounter within last 12 months    Recent Outpatient Visits           1 month ago Hyperlipidemia, unspecified hyperlipidemia type   [facility] Margarita Mail, DO   6 months ago Hyperlipidemia, unspecified hyperlipidemia type   [facility] Margarita Mail, DO   7 months ago Hoarseness of voice   [facility] Margarita Mail, DO   8 months ago Wheezing   [facility] Margarita Mail, DO   1 year ago Blood in stool   [facility] Margarita Mail, DO       Future Appointments             In 4 months Margarita Mail, DO [facility], [facility]            Passed - Lipid Panel completed within the last 12 months    Cholesterol  Date Value Ref Range Status  07/03/2022 191 <200 mg/dL Final   LDL Cholesterol (Calc)  Date Value Ref Range Status  07/03/2022 101 (H) mg/dL (calc) Final    Comment:    Reference range: <100 . Desirable range <100 mg/dL for primary prevention;   <70 mg/dL for patients with CHD or diabetic patients  with > or = 2 CHD risk factors. Marland Kitchen LDL-C is now calculated using the Martin-Hopkins  calculation, which is a validated novel method providing  better accuracy than the Friedewald equation in the  estimation of LDL-C.  Horald Pollen et al. Lenox Ahr. 1191;478(29): 2061-2068  (http://education.[facility].com/faq/FAQ164)    HDL  Date Value Ref Range Status  07/03/2022 61 > OR = 50 mg/dL Final   Triglycerides  Date Value Ref Range Status   07/03/2022 174 (H) <150 mg/dL Final

## 2022-08-25 ENCOUNTER — Other Ambulatory Visit: Payer: Self-pay | Admitting: Internal Medicine

## 2022-08-25 DIAGNOSIS — Z8711 Personal history of peptic ulcer disease: Secondary | ICD-10-CM

## 2022-08-26 NOTE — Telephone Encounter (Signed)
Requested Prescriptions  Pending Prescriptions Disp Refills   sucralfate (CARAFATE) 1 g tablet [Pharmacy Med Name: SUCRALFATE 1 GM TABLET] 360 tablet 0    Sig: TAKE 1 TABLET 4 TIMES DAILY WITH MEALS AND AT BEDTIME.     Gastroenterology: Antiacids Passed - 08/25/2022  2:58 AM      Passed - Valid encounter within last 12 months    Recent Outpatient Visits           1 month ago Hyperlipidemia, unspecified hyperlipidemia type   [facility] Margarita Mail, DO   6 months ago Hyperlipidemia, unspecified hyperlipidemia type   [facility] Margarita Mail, DO   8 months ago Hoarseness of voice   [facility] Margarita Mail, DO   9 months ago Wheezing   [facility] Margarita Mail, DO   1 year ago Blood in stool   [facility] Margarita Mail, DO       Future Appointments             In 4 months Margarita Mail, DO [facility], [facility]

## 2022-10-07 NOTE — Progress Notes (Unsigned)
Established Patient Office Visit  Subjective:  Patient ID: Kirsten Perez, female    DOB: 1940-07-15  Age: 82 y.o. MRN: [id number]  CC:  No chief complaint on file.   HPI Kirsten Perez is here for follow up on chronic medical conditions.   Chronic cough,wheezing:  -Dry cough since gastric ulcer in May 2022, occasionally will bring up a thick yellow sputum -Chest x-ray obtained from 11/29/2021 showing low lung volumes with mild bibasilar subsegmental atelectasis with a mild bilateral interstitial prominence noted.  The radiologist noted that an active process including pneumonitis could not be excluded. 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 -At [facility] she recently finished Breztri and Flovent - and symptoms had resolved -Today she states mild wheezing and post-nasal drip started again recently, does not take anything for allergies   HLD: -Medications: Repatha every 2 weeks -Last lipid panel: Lipid Panel     Component Value Date/Time   CHOL 191 07/03/2022 1129   TRIG 174 (H) 07/03/2022 1129   HDL 61 07/03/2022 1129   CHOLHDL 3.1 07/03/2022 1129   LDLCALC 101 (H) 07/03/2022 1129    Gastric Ulcer: -Currently on Protonix 40 mg in the morning, Pepcid 20 mg, Carafate 1 g daily  -Was hospitalized after having dark red blood in stool in May, 2022 had an EGD and found to have a bleeding ulcer in [place] -Was evaluated by GI, note reviewed on 10/17/2021.  Pre-Diabetes: -A1c 6/24 6.4%  Health Maintenance: -Blood work due -Mammogram - does not continue with screening -Colon cancer screening - does not continue with screening  Past Medical History:  Diagnosis Date   Gastric ulcer    Hyperlipidemia     Past Surgical History:  Procedure Laterality Date   CHOLECYSTECTOMY     ROTATOR CUFF REPAIR Right      Family History  Problem Relation Age of Onset   Hypertension Mother    Stroke Mother    Heart disease Father     Social History   Socioeconomic History   Marital status: Widowed    Spouse name: Not on file   Number of children: Not on file   Years of education: Not on file   Highest education level: 12th grade  Occupational History   Not on file  Tobacco Use   Smoking status: Former    Current packs/day: 0.00    Types: Cigarettes    Quit date: 02/27/2014    Years since quitting: 8.6   Smokeless tobacco: Never   Tobacco comments:    Smoked a pack or less a week.  Vaping Use   Vaping status: Never Used  Substance and Sexual Activity   Alcohol use: Never   Drug use: Never   Sexual activity: Not Currently  Other Topics Concern   Not on file  Social History Narrative   Not on file   Social Determinants of Health   Financial Resource Strain: Low Risk  (10/05/2022)   Overall Financial Resource Strain (CARDIA)    Difficulty of Paying Living Expenses: Not very hard  Food Insecurity: No Food Insecurity (10/05/2022)   Hunger Vital Sign    Worried About Running Out of Food in the Last Year: Never true    Ran Out of Food in the Last Year: Never true  Transportation Needs: No Transportation Needs (10/05/2022)   PRAPARE - Transportation  Lack of Transportation (Medical): No    Lack of Transportation (Non-Medical): No  Physical Activity: Insufficiently Active (10/05/2022)   Exercise Vital Sign    Days of Exercise per Week: 3 days    Minutes of Exercise per Session: 20 min  Stress: No Stress Concern Present (10/05/2022)   [employer] of Occupational Health - Occupational Stress Questionnaire    Feeling of Stress : Not at all  Social Connections: Socially Isolated (10/05/2022)   Social Connection and Isolation Panel [NHANES]    Frequency of Communication with Friends and Family: Three times a week    Frequency of Social Gatherings with Friends and Family: Patient declined     Attends Religious Services: Never    Database administrator or Organizations: No    Attends Banker Meetings: Never    Marital Status: Widowed  Intimate Partner Violence: Not At Risk (03/20/2022)   Humiliation, Afraid, Rape, and Kick questionnaire    Fear of Current or Ex-Partner: No    Emotionally Abused: No    Physically Abused: No    Sexually Abused: No    ROS Review of Systems  Constitutional:  Negative for chills, fatigue and fever.  HENT:  Positive for postnasal drip. Negative for congestion.   Respiratory:  Positive for cough and wheezing. Negative for shortness of breath.   Cardiovascular:  Negative for chest pain.  Neurological:  Negative for dizziness and headaches.    Objective:   Today's Vitals: There were no vitals taken for this visit.  Physical Exam Constitutional:      Appearance: Normal appearance.  HENT:     Head: Normocephalic and atraumatic.     Mouth/Throat:     Mouth: Mucous membranes are moist.     Pharynx: Oropharynx is clear.     Comments: Mild postnasal drip present Eyes:     Conjunctiva/sclera: Conjunctivae normal.  Neck:     Comments: Fullness palpated in the proximal left supra clavicular area, no pain to palpation Cardiovascular:     Rate and Rhythm: Normal rate and regular rhythm.  Pulmonary:     Effort: Pulmonary effort is normal.     Breath sounds: Wheezing present.  Musculoskeletal:     Right lower leg: No edema.     Left lower leg: No edema.  Skin:    General: Skin is warm and dry.  Neurological:     General: No focal deficit present.     Mental Status: She is alert. Mental status is at baseline.  Psychiatric:        Mood and Affect: Mood normal.        Behavior: Behavior normal.     Assessment & Plan:   1. Hyperlipidemia, unspecified hyperlipidemia type/Statin intolerance: On Repatha but about to hit the donut hole, she will let me know if she would like to discuss with pharmacist. Recheck lipid panel today.    - Lipid Profile  2. Wheezing: Thinking this is due to allergies, will start daily oral anti-histamine. Sample of Breztri given to use as needed.  3. History of gastric ulcer: Had some bloating and abdominal pain but no changes in stools, no bloody stools. Labs due. Continue Protonix 40 mg in the morning, Pepcid 20 mg, Carafate 1 g daily.   - CBC w/Diff/Platelet - COMPLETE METABOLIC PANEL WITH GFR  4. Elevated glucose: Check A1c with labs.   - HgB A1c   Follow-up: No follow-ups on file.   Margarita Mail, DO

## 2022-10-09 ENCOUNTER — Ambulatory Visit: Payer: No Typology Code available for payment source | Admitting: Internal Medicine

## 2022-10-09 ENCOUNTER — Encounter: Payer: Self-pay | Admitting: Internal Medicine

## 2022-10-09 VITALS — BP 118/83 | HR 83 | Temp 97.5°F | Wt 152.2 lb

## 2022-10-09 DIAGNOSIS — Z789 Other specified health status: Secondary | ICD-10-CM | POA: Diagnosis not present

## 2022-10-09 DIAGNOSIS — E1165 Type 2 diabetes mellitus with hyperglycemia: Secondary | ICD-10-CM | POA: Diagnosis not present

## 2022-10-09 DIAGNOSIS — R053 Chronic cough: Secondary | ICD-10-CM | POA: Diagnosis not present

## 2022-10-09 DIAGNOSIS — E785 Hyperlipidemia, unspecified: Secondary | ICD-10-CM | POA: Diagnosis not present

## 2022-10-09 LAB — POCT GLYCOSYLATED HEMOGLOBIN (HGB A1C): Hemoglobin A1C: 6.5 % — AB (ref 4.0–5.6)

## 2022-10-09 MED ORDER — BENZONATATE 100 MG PO CAPS: 100.0000 mg | ORAL_CAPSULE | Freq: Two times a day (BID) | ORAL | 0 refills | Status: DC | PRN

## 2022-10-09 NOTE — Patient Instructions (Addendum)
It was great seeing you today!  Plan discussed at today's visit: -A1c 6.5% which now a diagnosis of diabetes  -Hold Repatha for 1 month -Try Trelegy daily, cough medication sent to pharmacy   Follow up in: 1 month   Take care and let us know if you have any questions or concerns prior to your next visit.  Dr. Caralee Ates  Diabetes Mellitus and Nutrition, Adult When you have diabetes, or diabetes mellitus, it is very important to have healthy eating habits because your blood sugar (glucose) levels are greatly affected by what you eat and drink. Eating healthy foods in the right amounts, at about the same times every day, can help you: Manage your blood glucose. Lower your risk of heart disease. Improve your blood pressure. Reach or maintain a healthy weight. What can affect my meal plan? Every person with diabetes is different, and each person has different needs for a meal plan. Your health care provider may recommend that you work with a dietitian to make a meal plan that is best for you. Your meal plan may vary depending on factors such as: The calories you need. The medicines you take. Your weight. Your blood glucose, blood pressure, and cholesterol levels. Your activity level. Other health conditions you have, such as heart or kidney disease. How do carbohydrates affect me? Carbohydrates, also called carbs, affect your blood glucose level more than any other type of food. Eating carbs raises the amount of glucose in your blood. It is important to know how many carbs you can safely have in each meal. This is different for every person. Your dietitian can help you calculate how many carbs you should have at each meal and for each snack. How does alcohol affect me? Alcohol can cause a decrease in blood glucose (hypoglycemia), especially if you use insulin or take certain diabetes medicines by mouth. Hypoglycemia can be a life-threatening condition. Symptoms of hypoglycemia, such as  sleepiness, dizziness, and confusion, are similar to symptoms of having too much alcohol. Do not drink alcohol if: Your health care provider tells you not to drink. You are pregnant, may be pregnant, or are planning to become pregnant. If you drink alcohol: Limit how much you have to: 0-1 drink a day for women. 0-2 drinks a day for men. Know how much alcohol is in your drink. In the [place] one drink equals one 12 oz bottle of beer (355 mL), one 5 oz glass of wine (148 mL), or one 1 oz glass of hard liquor (44 mL). Keep yourself hydrated with water, diet soda, or unsweetened iced tea. Keep in mind that regular soda, juice, and other mixers may contain a lot of sugar and must be counted as carbs. What are tips for following this plan?  Reading food labels Start by checking the serving size on the Nutrition Facts label of packaged foods and drinks. The number of calories and the amount of carbs, fats, and other nutrients listed on the label are based on one serving of the item. Many items contain more than one serving per package. Check the total grams (g) of carbs in one serving. Check the number of grams of saturated fats and trans fats in one serving. Choose foods that have a low amount or none of these fats. Check the number of milligrams (mg) of salt (sodium) in one serving. Most people should limit total sodium intake to less than 2,300 mg per day. Always check the nutrition information of foods labeled as "low-fat"  or "nonfat." These foods may be higher in added sugar or refined carbs and should be avoided. Talk to your dietitian to identify your daily goals for nutrients listed on the label. Shopping Avoid buying canned, pre-made, or processed foods. These foods tend to be high in fat, sodium, and added sugar. Shop around the outside edge of the grocery store. This is where you will most often find fresh fruits and vegetables, bulk grains, fresh meats, and fresh dairy  products. Cooking Use low-heat cooking methods, such as baking, instead of high-heat cooking methods, such as deep frying. Cook using healthy oils, such as olive, canola, or sunflower oil. Avoid cooking with butter, cream, or high-fat meats. Meal planning Eat meals and snacks regularly, preferably at the same times every day. Avoid going long periods of time without eating. Eat foods that are high in fiber, such as fresh fruits, vegetables, beans, and whole grains. Eat 4-6 oz (112-168 g) of lean protein each day, such as lean meat, chicken, fish, eggs, or tofu. One ounce (oz) (28 g) of lean protein is equal to: 1 oz (28 g) of meat, chicken, or fish. 1 egg.  cup (62 g) of tofu. Eat some foods each day that contain healthy fats, such as avocado, nuts, seeds, and fish. What foods should I eat? Fruits Berries. Apples. Oranges. Peaches. Apricots. Plums. Grapes. Mangoes. Papayas. Pomegranates. Kiwi. Cherries. Vegetables Leafy greens, including lettuce, spinach, kale, chard, collard greens, mustard greens, and cabbage. Beets. Cauliflower. Broccoli. Carrots. Green beans. Tomatoes. Peppers. Onions. Cucumbers. Brussels sprouts. Grains Whole grains, such as whole-wheat or whole-grain bread, crackers, tortillas, cereal, and pasta. Unsweetened oatmeal. Quinoa. Brown or wild rice. Meats and other proteins Seafood. Poultry without skin. Lean cuts of poultry and beef. Tofu. Nuts. Seeds. Dairy Low-fat or fat-free dairy products such as milk, yogurt, and cheese. The items listed above may not be a complete list of foods and beverages you can eat and drink. Contact a dietitian for more information. What foods should I avoid? Fruits Fruits canned with syrup. Vegetables Canned vegetables. Frozen vegetables with butter or cream sauce. Grains Refined white flour and flour products such as bread, pasta, snack foods, and cereals. Avoid all processed foods. Meats and other proteins Fatty cuts of meat.  Poultry with skin. Breaded or fried meats. Processed meat. Avoid saturated fats. Dairy Full-fat yogurt, cheese, or milk. Beverages Sweetened drinks, such as soda or iced tea. The items listed above may not be a complete list of foods and beverages you should avoid. Contact a dietitian for more information. Questions to ask a health care provider Do I need to meet with a certified diabetes care and education specialist? Do I need to meet with a dietitian? What number can I call if I have questions? When are the best times to check my blood glucose? Where to find more information: American Diabetes Association: diabetes.org Academy of Nutrition and Dietetics: eatright.[employer] of Diabetes and Digestive and Kidney Diseases: StageSync.si Association of Diabetes Care & Education Specialists: diabeteseducator.org Summary It is important to have healthy eating habits because your blood sugar (glucose) levels are greatly affected by what you eat and drink. It is important to use alcohol carefully. A healthy meal plan will help you manage your blood glucose and lower your risk of heart disease. Your health care provider may recommend that you work with a dietitian to make a meal plan that is best for you. This information is not intended to replace advice given to you by your  health care provider. Make sure you discuss any questions you have with your health care provider. Document Revised: 08/17/2019 Document Reviewed: 08/17/2019 Elsevier Patient Education  2024 [employer].

## 2022-10-10 ENCOUNTER — Telehealth: Payer: Self-pay | Admitting: Internal Medicine

## 2022-10-10 NOTE — Telephone Encounter (Signed)
Copied from CRM [id number]. Topic: General - Other >> Oct 10, 2022  9:09 AM Dondra Prader A wrote: Reason for CRM: Pt states that she seen her PCP yesterday and was given samples of trelegy medication and was told to take if for 28 days. Pt states the sample that she got from the office yesterday is only a 14 day supply. Pt is wanting to know if she is suppose to stop taking the medication after the 14 day supply or if she need to come to the office to pick up another sample. Please call pt back.

## 2022-10-14 DIAGNOSIS — E785 Hyperlipidemia, unspecified: Secondary | ICD-10-CM | POA: Diagnosis not present

## 2022-10-14 DIAGNOSIS — E1169 Type 2 diabetes mellitus with other specified complication: Secondary | ICD-10-CM | POA: Diagnosis not present

## 2022-10-14 DIAGNOSIS — E1122 Type 2 diabetes mellitus with diabetic chronic kidney disease: Secondary | ICD-10-CM | POA: Diagnosis not present

## 2022-10-14 DIAGNOSIS — Z008 Encounter for other general examination: Secondary | ICD-10-CM | POA: Diagnosis not present

## 2022-10-14 DIAGNOSIS — I700 Atherosclerosis of aorta: Secondary | ICD-10-CM | POA: Diagnosis not present

## 2022-10-14 DIAGNOSIS — E663 Overweight: Secondary | ICD-10-CM | POA: Diagnosis not present

## 2022-10-14 DIAGNOSIS — N182 Chronic kidney disease, stage 2 (mild): Secondary | ICD-10-CM | POA: Diagnosis not present

## 2022-10-14 DIAGNOSIS — Z6825 Body mass index (BMI) 25.0-25.9, adult: Secondary | ICD-10-CM | POA: Diagnosis not present

## 2022-11-07 ENCOUNTER — Ambulatory Visit: Payer: No Typology Code available for payment source | Admitting: Internal Medicine

## 2022-11-10 ENCOUNTER — Other Ambulatory Visit: Payer: Self-pay | Admitting: Internal Medicine

## 2022-11-16 NOTE — Progress Notes (Unsigned)
Established Patient Office Visit  Subjective:  Patient ID: Kirsten Perez, female    DOB: 03-14-1940  Age: 82 y.o. MRN: [id number]  CC:  No chief complaint on file.   HPI Kirsten Perez is here for follow up on chronic medical conditions.   Chronic cough,wheezing:  -Dry cough since gastric ulcer in May 2022, occasionally will bring up a thick yellow sputum -Chest x-ray obtained from 11/29/2021 showing low lung volumes with mild bibasilar subsegmental atelectasis with a mild bilateral interstitial prominence noted.  The radiologist noted that an active process including pneumonitis could not be excluded. 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 -At LOV she recently finished Breztri and Flovent - and symptoms had resolved -Unfortunately symptoms have returned and she is having cough and wheeze every day. Hard to fall asleep because of the dry cough. Not on any inhalers.   HLD: -Medications: had been on Repatha every 2 weeks - patient held medication for 1 month due to potential respiratory side effects  -Last lipid panel: Lipid Panel     Component Value Date/Time   CHOL 191 07/03/2022 1129   TRIG 174 (H) 07/03/2022 1129   HDL 61 07/03/2022 1129   CHOLHDL 3.1 07/03/2022 1129   LDLCALC 101 (H) 07/03/2022 1129    Gastric Ulcer: -Currently on Protonix 40 mg in the morning, Pepcid 20 mg, Carafate 1 g daily  -Was hospitalized after having dark red blood in stool in May, 2022 had an EGD and found to have a bleeding ulcer in [place] -Was evaluated by GI, note reviewed on 10/17/2021.  Diabetes, Type 2: -Last A1c 9/24 6.5% -Medications: *** -Patient is compliant with the above medications and reports no side effects. *** -Checking BG at home: *** -Fasting home BG: *** -Post-prandial home BG: *** -Highest home  BG since last visit: *** -Lowest home BG since last visit: *** -Diet: *** -Exercise: *** -Eye exam: *** -Foot exam: *** -Microalbumin: *** -Statin: *** -PNA vaccine: *** -Denies symptoms of hypoglycemia, polyuria, polydipsia, numbness extremities, foot ulcers/trauma. ***   Health Maintenance: -Blood work UTD -Mammogram - does not continue with screening -Colon cancer screening - does not continue with screening  Past Medical History:  Diagnosis Date   Gastric ulcer    Hyperlipidemia     Past Surgical History:  Procedure Laterality Date   CHOLECYSTECTOMY     ROTATOR CUFF REPAIR Right     Family History  Problem Relation Age of Onset   Hypertension Mother    Stroke Mother    Heart disease Father     Social History   Socioeconomic History   Marital status: Widowed    Spouse name: Not on file   Number of children: Not on file   Years of education: Not on file   Highest education level: 12th grade  Occupational History   Not on file  Tobacco Use   Smoking status: Former    Current packs/day: 0.00    Types: Cigarettes    Quit date: 02/27/2014    Years since quitting: 8.7   Smokeless tobacco: Never   Tobacco comments:    Smoked a pack or less a week.  Vaping Use   Vaping status: Never Used  Substance and Sexual Activity   Alcohol use: Never   Drug use: Never   Sexual activity: Not Currently  Other Topics Concern  Not on file  Social History Narrative   Not on file   Social Determinants of Health   Financial Resource Strain: Low Risk  (10/05/2022)   Overall Financial Resource Strain (CARDIA)    Difficulty of Paying Living Expenses: Not very hard  Food Insecurity: No Food Insecurity (10/05/2022)   Hunger Vital Sign    Worried About Running Out of Food in the Last Year: Never true    Ran Out of Food in the Last Year: Never true  Transportation Needs: No Transportation Needs (10/05/2022)   PRAPARE - Administrator, Civil Service (Medical): No     Lack of Transportation (Non-Medical): No  Physical Activity: Insufficiently Active (10/05/2022)   Exercise Vital Sign    Days of Exercise per Week: 3 days    Minutes of Exercise per Session: 20 min  Stress: No Stress Concern Present (10/05/2022)   [employer] of Occupational Health - Occupational Stress Questionnaire    Feeling of Stress : Not at all  Social Connections: Socially Isolated (10/05/2022)   Social Connection and Isolation Panel [NHANES]    Frequency of Communication with Friends and Family: Three times a week    Frequency of Social Gatherings with Friends and Family: Patient declined    Attends Religious Services: Never    Database administrator or Organizations: No    Attends Banker Meetings: Never    Marital Status: Widowed  Intimate Partner Violence: Not At Risk (03/20/2022)   Humiliation, Afraid, Rape, and Kick questionnaire    Fear of Current or Ex-Partner: No    Emotionally Abused: No    Physically Abused: No    Sexually Abused: No    ROS Review of Systems  Constitutional:  Negative for chills, fatigue and fever.  HENT:  Positive for postnasal drip. Negative for congestion.   Respiratory:  Positive for cough and wheezing. Negative for shortness of breath.   Cardiovascular:  Negative for chest pain.  Neurological:  Negative for dizziness and headaches.    Objective:   Today's Vitals: There were no vitals taken for this visit.  Physical Exam Constitutional:      Appearance: Normal appearance.  HENT:     Head: Normocephalic and atraumatic.     Mouth/Throat:     Mouth: Mucous membranes are moist.     Pharynx: Oropharynx is clear.     Comments: Mild postnasal drip present Eyes:     Conjunctiva/sclera: Conjunctivae normal.  Neck:     Comments: Fullness palpated in the proximal left supra clavicular area, no pain to palpation Cardiovascular:     Rate and Rhythm: Normal rate and regular rhythm.  Pulmonary:     Effort: Pulmonary effort is  normal.     Breath sounds: Wheezing present.  Musculoskeletal:     Right lower leg: No edema.     Left lower leg: No edema.  Skin:    General: Skin is warm and dry.  Neurological:     General: No focal deficit present.     Mental Status: She is alert. Mental status is at baseline.  Psychiatric:        Mood and Affect: Mood normal.        Behavior: Behavior normal.     Assessment & Plan:   1. Type 2 diabetes mellitus with hyperglycemia, without long-term current use of insulin (HCC): A1c 6.5%, patient now new onset diabetic. Discussed diet and lifestyle change. Will hold off on medication for now, recheck in 3  months.   - POCT HgB A1C  2. Chronic cough/Hyperlipidemia, unspecified hyperlipidemia type/Statin intolerance: Uncertain etiology. Work up last year negative. Will resent tessalon perles, sample of Trelegy given. Will hold Repatha for 1 month and see if symptoms resolve. Cannot take statins. Reviewed lipid panel with the patient, discussed increased risk of heart and stroke with discontinuing the Repatha but patient states she is having decreased quality of life with current respiratory symptoms.   - benzonatate (TESSALON) 100 MG capsule; Take 1 capsule (100 mg total) by mouth 2 (two) times daily as needed for cough.  Dispense: 90 capsule; Refill: 0   Patient expressed understanding and is in agreement with plan as outlined above. All questions were answered. Today, I spent 45 minutes on patient care, >50% of time was spent face-to-face with the patient.    Follow-up: No follow-ups on file.   Margarita Mail, DO

## 2022-11-17 ENCOUNTER — Encounter: Payer: Self-pay | Admitting: Internal Medicine

## 2022-11-17 ENCOUNTER — Ambulatory Visit (INDEPENDENT_AMBULATORY_CARE_PROVIDER_SITE_OTHER): Payer: No Typology Code available for payment source | Admitting: Internal Medicine

## 2022-11-17 VITALS — BP 136/84 | HR 77 | Temp 96.9°F | Resp 18 | Ht 64.0 in | Wt 149.7 lb

## 2022-11-17 DIAGNOSIS — E1165 Type 2 diabetes mellitus with hyperglycemia: Secondary | ICD-10-CM

## 2022-11-17 DIAGNOSIS — E782 Mixed hyperlipidemia: Secondary | ICD-10-CM

## 2022-11-17 DIAGNOSIS — Z8711 Personal history of peptic ulcer disease: Secondary | ICD-10-CM | POA: Diagnosis not present

## 2022-11-17 DIAGNOSIS — I700 Atherosclerosis of aorta: Secondary | ICD-10-CM

## 2022-11-17 MED ORDER — FAMOTIDINE 20 MG PO TABS: 20.0000 mg | ORAL_TABLET | Freq: Every day | ORAL | 1 refills | Status: DC

## 2022-11-17 MED ORDER — FREESTYLE LIBRE 2 SENSOR MISC: 1.0000 | 3 refills | Status: DC

## 2022-11-17 MED ORDER — NEXLIZET 180-10 MG PO TABS: 1.0000 | ORAL_TABLET | Freq: Every day | ORAL | 0 refills | Status: DC

## 2022-11-17 NOTE — Patient Instructions (Signed)
It was great seeing you today!  Plan discussed at today's visit: -Finish Repatha, then hold off on cholesterol medications for 2-4 weeks and then start Nexlizet  -Prescribed Free [employer] sensors  Follow up in: 3 months   Take care and let us know if you have any questions or concerns prior to your next visit.  Dr. Caralee Ates  Hypoglycemia Hypoglycemia is when the amount of sugar, or glucose, in your blood is too low. Low blood sugar can happen if you have diabetes or if you don't have diabetes. It may be an emergency. What are the causes? Low blood sugar happens most often in people who have diabetes. It may be caused by: Diabetes medicine. Not eating enough, or not eating often enough. Being more active than normal. If you don't have diabetes, you may still get low blood sugar if: There's a tumor in your pancreas. A tumor is a growth of cells that isn't normal. You don't eat enough, or you fast. Fasting is when you don't eat for long periods at a time. You have a bad infection or illness. You have problems after weight loss surgery. You have kidney or liver problems. You take certain medicines. What increases the risk? You're more likely to have low blood sugar if: You have diabetes and take medicine for it. You drink a lot of alcohol. You get sick. What are the signs or symptoms? Mild Hunger or feeling like you may vomit. Sweating and feeling cold to the touch. Feeling dizzy or light-headed. Being sleepy or having trouble sleeping. A headache. Blurry vision. Mood changes. These include feeling worried, nervous, or easily annoyed. Moderate Feeling confused. Changes in the way you act. Weakness. An uneven heartbeat. Very bad Having very low blood sugar is an emergency. It can cause: Fainting. Seizures. A coma. Death. How is this diagnosed?  Low blood sugar can be found with a blood test. This test tells you how much sugar is in your blood. It's done while you're  having symptoms. Your health care provider may also do an exam and look at your medical history. How is this treated? Treating low blood sugar If you have low blood sugar, eat or drink something with sugar in it right away. The food or drink should have 15 grams of a fast-acting carbohydrate (carb). Options include: 4 oz (120 mL) of fruit juice. 4 oz (120 mL) of soda (not diet soda). A few pieces of hard candy. Check food labels to see how many pieces to eat. 1 Tbsp (15 mL) of sugar or honey. 4 glucose tablets. 1 tube of glucose gel. Treating low blood sugar if you have diabetes Talk with your provider about how much carb you should take. If you're alert and can swallow safely, you may follow the 15:15 rule: Take 15 grams of a fast-acting carb. Check your blood sugar 15 minutes after you take the carb. If your blood sugar is still at or below 70 mg/dL (3.9 mmol/L), take 15 grams of a carb again. If your blood sugar doesn't go above 70 mg/dL (3.9 mmol/L) after 3 tries, get help right away. After your blood sugar goes back to normal, eat a meal or a snack within 1 hour. Always keep 15 grams of a fast-acting carb with you. This could be: 4 glucose tablets. A few pieces of hard candy. 1 Tbsp (15 mL) of honey or sugar. 1 tube of glucose gel. Treating very low blood sugar If your blood sugar is less than 54  mg/dL (3 mmol/L), it's an emergency. Get help right away. If you can't eat or drink, you will need to be given glucagon. A family member or friend should learn how to check your blood sugar and give you glucagon. Ask your provider if you should keep a glucagon kit at home. You may also need to be treated in a hospital. Follow these instructions at home: If you have diabetes: Always keep a fast-acting carb (15 grams) with you. Follow your diabetes care plan. Make sure you: Know the symptoms of low blood sugar. Check your blood sugar as often as told. Always check it before and after you  exercise. Always check your blood sugar before you drive. Take your medicines as told. Eat on time. Do not skip meals. Share your diabetes care plan with: Your work or school. The people you live with. Wear an alert bracelet or carry a card that says you have diabetes. General instructions If you drink alcohol: Limit how much you have to: 0-1 drink a day if you're female. 0-2 drinks a day if you're female. Know how much alcohol is in your drink. In the [place] one drink is one 12 oz bottle of beer (355 mL), one 5 oz glass of wine (148 mL), or one 1 oz glass of hard liquor (44 mL). Be sure to eat food when you drink alcohol. Be sure to check your blood sugar after you drink. Alcohol may lead to low blood sugar later. Where to find more information American Diabetes Association (ADA): diabetes.org Contact a health care provider if: You have low blood sugar often. You have diabetes and are having trouble keeping your blood sugar in the right range. Get help right away if: You can't get your blood sugar above 70 mg/dL (3.9 mmol/L) after 3 tries. Your blood sugar is below 54 mg/dL (3 mmol/L). You have a seizure. You faint. These symptoms may be an emergency. Call 911 right away. Do not wait to see if the symptoms will go away. Do not drive yourself to the hospital. This information is not intended to replace advice given to you by your health care provider. Make sure you discuss any questions you have with your health care provider. Document Revised: 04/02/2022 Document Reviewed: 04/02/2022 Elsevier Patient Education  2024 [employer].

## 2022-11-18 LAB — MICROALBUMIN / CREATININE URINE RATIO
Creatinine, Urine: 135 mg/dL (ref 20–275)
Microalb Creat Ratio: 8 mg/g{creat} (ref <30)
Microalb, Ur: 1.1 mg/dL

## 2023-01-02 ENCOUNTER — Ambulatory Visit: Payer: No Typology Code available for payment source | Admitting: Internal Medicine

## 2023-01-07 NOTE — Progress Notes (Unsigned)
   Established Patient Office Visit  Subjective   Patient ID: Kirsten Perez, female    DOB: September 10, 1940  Age: 82 y.o. MRN: [id number]  No chief complaint on file.   HPI  Chronic cough,wheezing:  -Dry cough since gastric ulcer in May 2022, occasionally will bring up a thick yellow sputum -Chest x-ray obtained from 11/29/2021 showing low lung volumes with mild bibasilar subsegmental atelectasis with a mild bilateral interstitial prominence noted.  The radiologist noted that an active process including pneumonitis could not be excluded. 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 -At LOV she recently finished Breztri and Flovent - and symptoms had resolved -Unfortunately symptoms have returned and she is having cough and wheeze every day. Hard to fall asleep because of the dry cough. Not on any inhalers.  -Symptoms resolved since stopping Repatha last month.  HLD: -Medications: had been on Repatha every 2 weeks - patient held medication for 1 month due to potential respiratory side effects - today she states these symptoms have resolved -Hx of statin intolerance - caused myalgias  -Last lipid panel: Lipid Panel     Component Value Date/Time   CHOL 191 07/03/2022 1129   TRIG 174 (H) 07/03/2022 1129   HDL 61 07/03/2022 1129   CHOLHDL 3.1 07/03/2022 1129   LDLCALC 101 (H) 07/03/2022 1129    Gastric Ulcer: -Currently on Protonix 40 mg in the morning, Pepcid 20 mg, Carafate 1 g daily  -Was hospitalized after having dark red blood in stool in May, 2022 had an EGD and found to have a bleeding ulcer in [place] -Was evaluated by GI, note reviewed on 10/17/2021.  Diabetes, Type 2: -Last A1c 9/24 6.5% -Medications: Nothing -Checking BG at home: yes has Free [employer] 2 -Eye exam: Due, discuss at follow up -Foot  exam: Due, discuss at follow up -Microalbumin: Due -Statin: No, see above -PNA vaccine: UTD -Denies polyuria, polydipsia, numbness extremities, foot ulcers/trauma.   Health Maintenance: -Blood work UTD -Mammogram - does not continue with screening -Colon cancer screening - does not continue with screening  {History (Optional):23778}  ROS    Objective:     There were no vitals taken for this visit. {Vitals History (Optional):23777}  Physical Exam   No results found for any visits on 01/08/23.  {Labs (Optional):23779}  The ASCVD Risk score (Arnett DK, et al., 2019) failed to calculate for the following reasons:   The 2019 ASCVD risk score is only valid for ages 42 to 51    Assessment & Plan:  There are no diagnoses linked to this encounter.   No follow-ups on file.    Margarita Mail, DO

## 2023-01-08 ENCOUNTER — Ambulatory Visit (INDEPENDENT_AMBULATORY_CARE_PROVIDER_SITE_OTHER): Payer: No Typology Code available for payment source | Admitting: Internal Medicine

## 2023-01-08 ENCOUNTER — Other Ambulatory Visit: Payer: Self-pay

## 2023-01-08 ENCOUNTER — Encounter: Payer: Self-pay | Admitting: Internal Medicine

## 2023-01-08 VITALS — BP 130/78 | HR 86 | Temp 97.7°F | Resp 14 | Ht 62.5 in | Wt 150.8 lb

## 2023-01-08 DIAGNOSIS — E1165 Type 2 diabetes mellitus with hyperglycemia: Secondary | ICD-10-CM | POA: Diagnosis not present

## 2023-01-08 DIAGNOSIS — I700 Atherosclerosis of aorta: Secondary | ICD-10-CM | POA: Diagnosis not present

## 2023-01-08 DIAGNOSIS — R062 Wheezing: Secondary | ICD-10-CM

## 2023-01-08 DIAGNOSIS — E782 Mixed hyperlipidemia: Secondary | ICD-10-CM | POA: Diagnosis not present

## 2023-01-08 DIAGNOSIS — Z8711 Personal history of peptic ulcer disease: Secondary | ICD-10-CM

## 2023-01-08 MED ORDER — FREESTYLE LIBRE 3 PLUS SENSOR MISC: 1 refills | Status: DC

## 2023-01-08 MED ORDER — PANTOPRAZOLE SODIUM 40 MG PO TBEC: 40.0000 mg | DELAYED_RELEASE_TABLET | Freq: Every day | ORAL | 3 refills | Status: DC

## 2023-01-08 NOTE — Assessment & Plan Note (Signed)
Stable, no changes to medications, appropriate refills ordered today.

## 2023-01-08 NOTE — Assessment & Plan Note (Signed)
Repatha has been held for the last 2 months, as it was thought that could be contributing to her chronic wheezing, however the wheezing has mildly started up again.  Repeat cholesterol panel today.

## 2023-01-08 NOTE — Assessment & Plan Note (Signed)
Not currently on medication, recheck A1c today.  Diabetic foot exam done today as well.

## 2023-01-09 ENCOUNTER — Other Ambulatory Visit: Payer: Self-pay | Admitting: Internal Medicine

## 2023-01-09 DIAGNOSIS — E1165 Type 2 diabetes mellitus with hyperglycemia: Secondary | ICD-10-CM

## 2023-01-09 DIAGNOSIS — I700 Atherosclerosis of aorta: Secondary | ICD-10-CM

## 2023-01-09 DIAGNOSIS — E782 Mixed hyperlipidemia: Secondary | ICD-10-CM

## 2023-01-09 LAB — LIPID PANEL
Cholesterol: 315 mg/dL — ABNORMAL HIGH (ref <200)
HDL: 66 mg/dL (ref >=50)
LDL Cholesterol (Calc): 222 mg/dL — ABNORMAL HIGH
Non-HDL Cholesterol (Calc): 249 mg/dL — ABNORMAL HIGH (ref <130)
Total CHOL/HDL Ratio: 4.8 (calc) (ref <5.0)
Triglycerides: 129 mg/dL (ref <150)

## 2023-01-09 LAB — HEMOGLOBIN A1C
Hgb A1c MFr Bld: 6.7 %{Hb} — ABNORMAL HIGH (ref <5.7)
Mean Plasma Glucose: 146 mg/dL
eAG (mmol/L): 8.1 mmol/L

## 2023-01-09 MED ORDER — METFORMIN HCL 500 MG PO TABS: 500.0000 mg | ORAL_TABLET | Freq: Every day | ORAL | 1 refills | Status: DC

## 2023-01-09 MED ORDER — NEXLIZET 180-10 MG PO TABS: 1.0000 | ORAL_TABLET | Freq: Every day | ORAL | 1 refills | Status: DC

## 2023-01-18 IMAGING — US US SOFT TISSUE HEAD/NECK
1 series · 14 of 17 positions shown · non-contrast
Comparison: None Available.

CLINICAL DATA: 81-year-old female with supraclavicular fullness

EXAM:
ULTRASOUND OF HEAD/NECK SOFT TISSUES
TECHNIQUE: Ultrasound examination of the head and neck soft tissues was
performed in the area of clinical concern.

[Series 1: us soft tissue head & neck (non-thyroid) · 17 acquisitions, 14 frames shown]
[im 1/17]
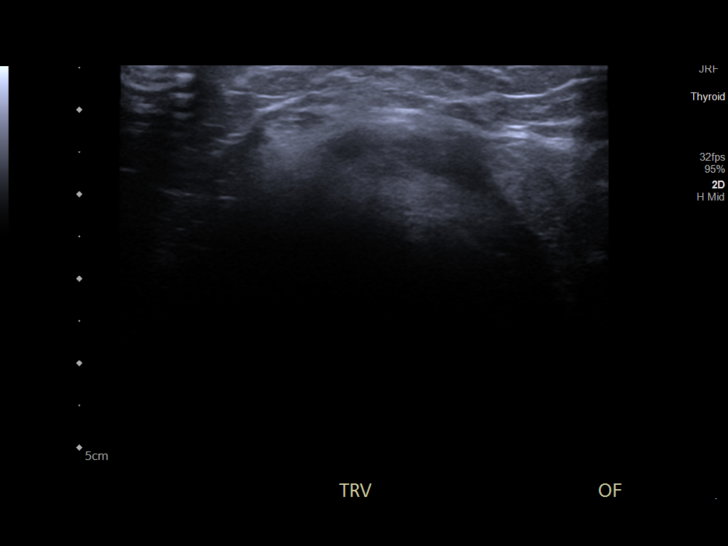
[im 2/17]
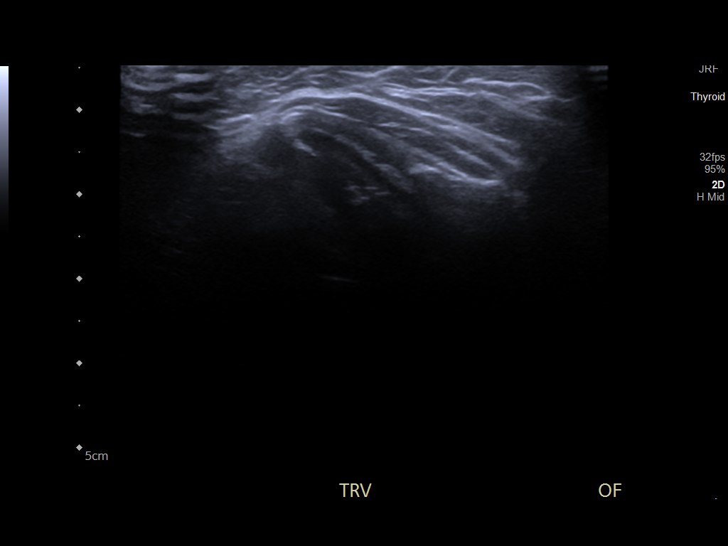
[im 4/17]
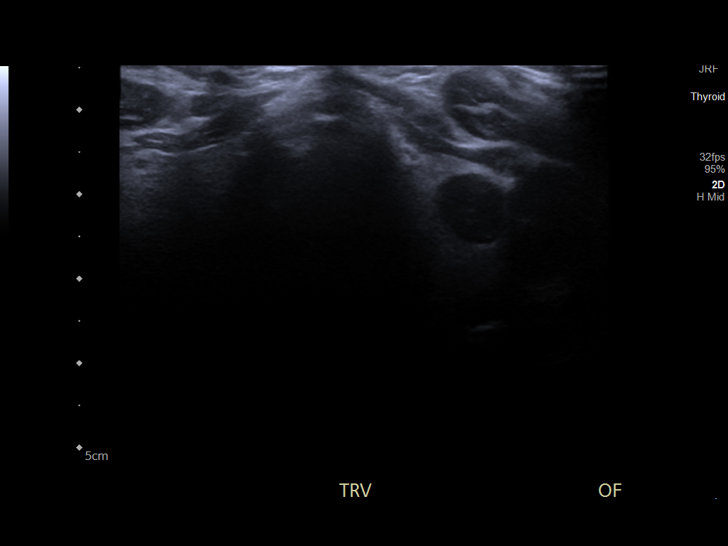
[im 5/17]
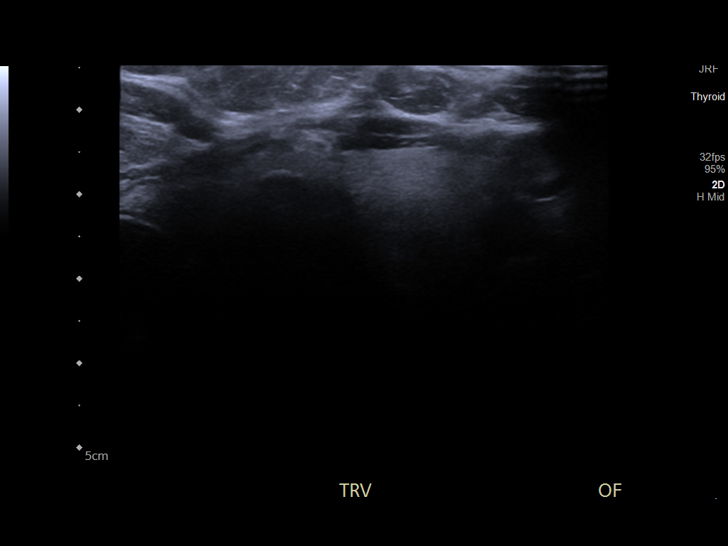
[im 6/17]
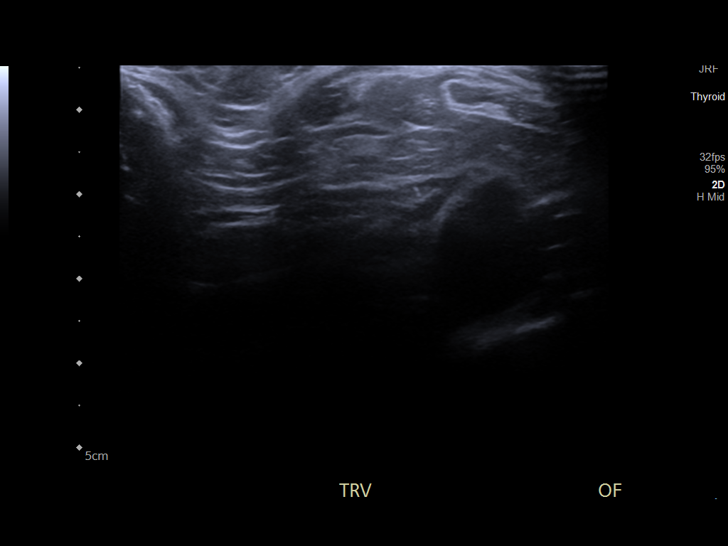
[im 7/17]
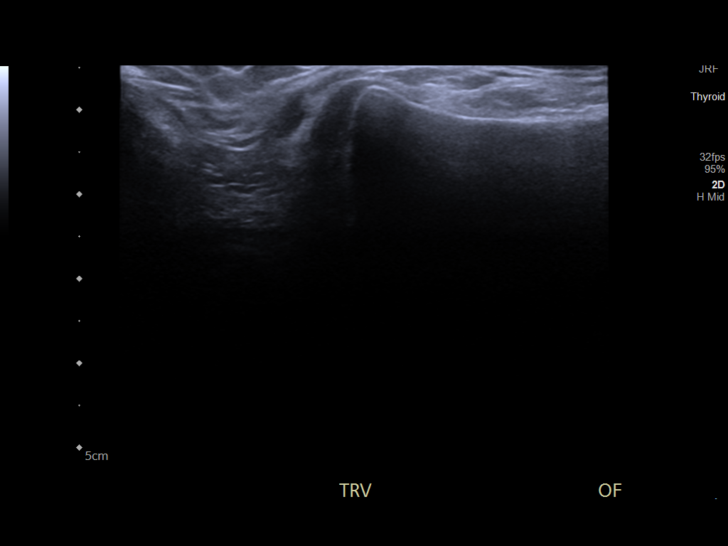
[im 8/17]
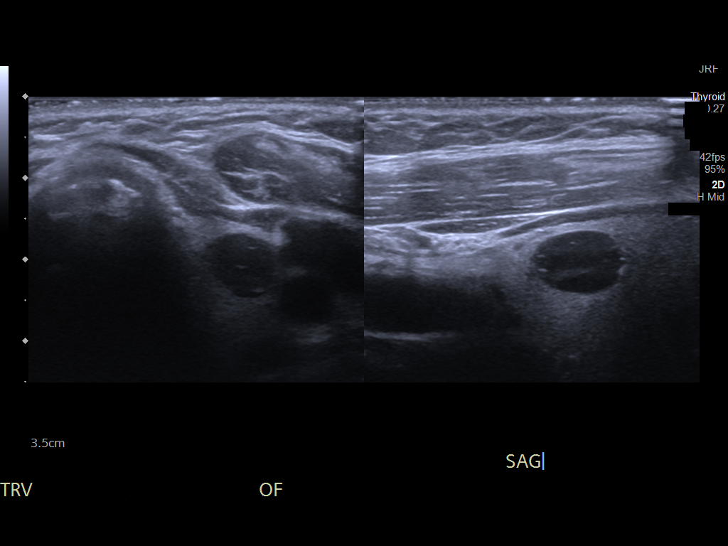
[im 10/17]
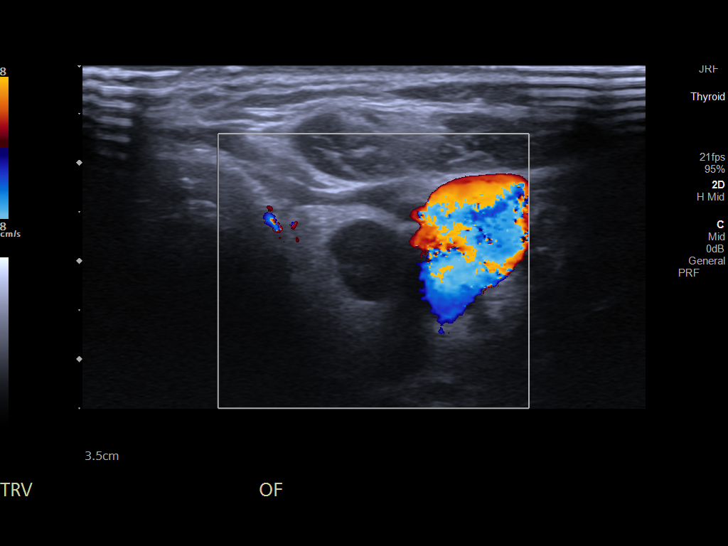
[im 11/17]
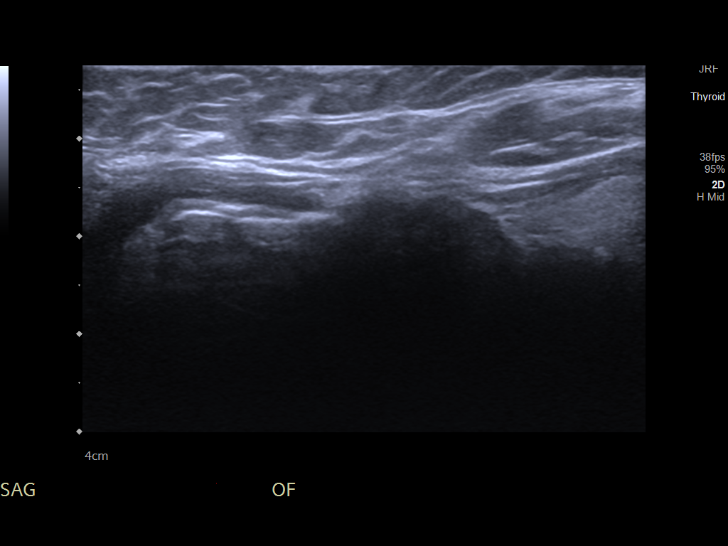
[im 12/17]
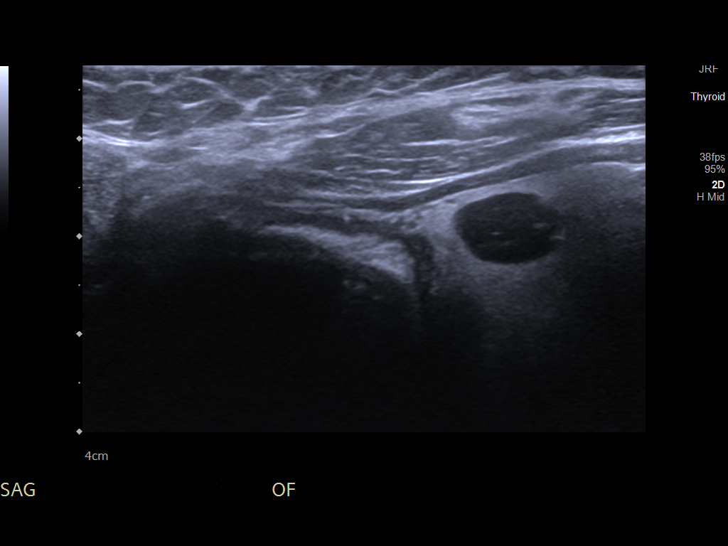
[im 13/17]
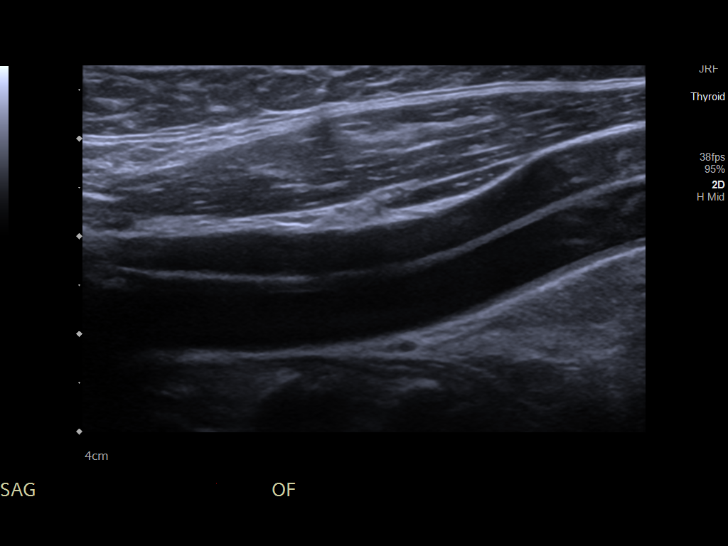
[im 14/17]
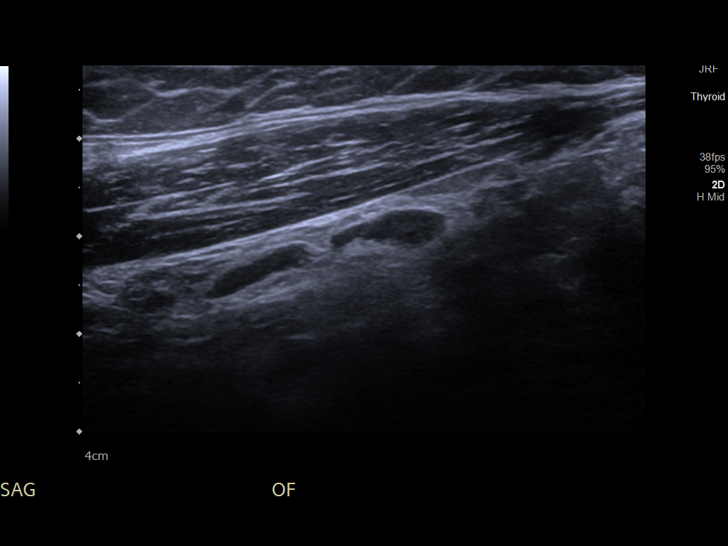
[im 16/17]
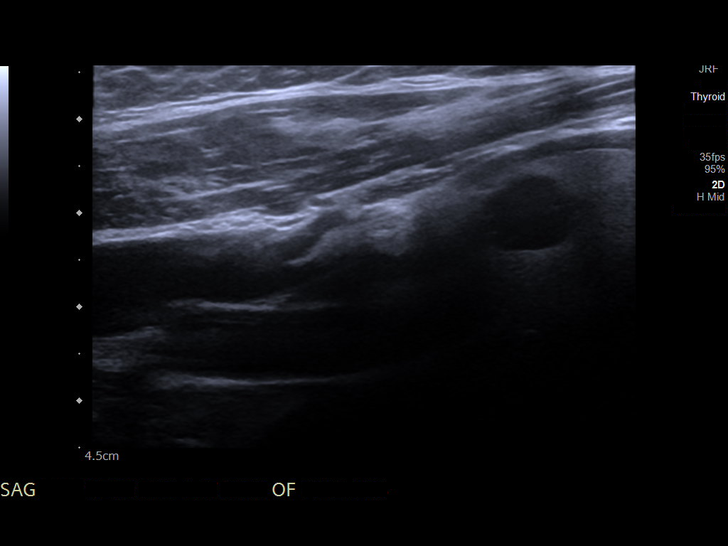
[im 17/17]
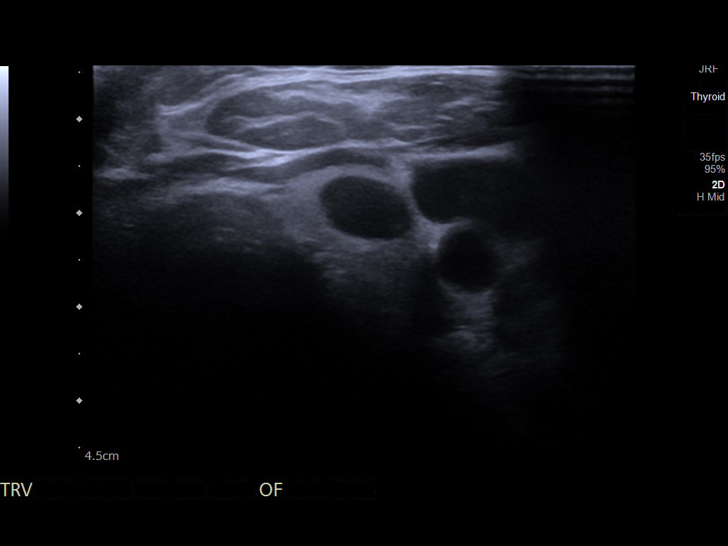

[14 of 17 positions shown; findings below may reference images not displayed]

FINDINGS: Grayscale and color duplex performed in the region of clinical
concern.

No adenopathy.  No soft tissue lesions.  No focal fluid.

TR 2/cystic left thyroid cyst/nodule, 1.1 cm. Nodule does not meet
criteria for surveillance. Recommendations follow those established
by the new ACR TI-RADS criteria ([HOSPITAL] 3106;[DATE]).
IMPRESSION: Relatively unremarkable sonographic survey in the region clinical
concern.

## 2023-02-03 ENCOUNTER — Other Ambulatory Visit: Payer: Self-pay | Admitting: Internal Medicine

## 2023-02-03 DIAGNOSIS — I700 Atherosclerosis of aorta: Secondary | ICD-10-CM

## 2023-02-03 DIAGNOSIS — E782 Mixed hyperlipidemia: Secondary | ICD-10-CM

## 2023-02-03 DIAGNOSIS — Z8711 Personal history of peptic ulcer disease: Secondary | ICD-10-CM

## 2023-02-03 NOTE — Telephone Encounter (Signed)
 Medication Refill -  Most Recent Primary Care Visit:  Provider: BERNARDO FEND  Department: [facility]  Visit Type: OFFICE VISIT  Date: 01/08/2023  Medication: sucralfate  (CARAFATE ) 1 g tablet   Bempedoic Acid-Ezetimibe  (NEXLIZET ) 180-10 MG TABS  (pt wants to know if she can have a 90 day supply instead of 30 days) she is going out of town  Has the patient contacted their pharmacy? No   Is this the correct pharmacy for this prescription? Yes  This is the patient's preferred pharmacy: orrect one.  [facility] [address] [facility] - [place], [place] - [address] AT [facility] [address] [place] [address] Phone: [phone] Fax: [phone]   Has the prescription been filled recently? Yes  Is the patient out of the medication? Yes  Has the patient been seen for an appointment in the last year OR does the patient have an upcoming appointment? Yes  Can we respond through MyChart? No  Agent: Please be advised that Rx refills may take up to 3 business days. We ask that you follow-up with your pharmacy.

## 2023-02-04 DIAGNOSIS — Z6826 Body mass index (BMI) 26.0-26.9, adult: Secondary | ICD-10-CM | POA: Diagnosis not present

## 2023-02-04 DIAGNOSIS — E663 Overweight: Secondary | ICD-10-CM | POA: Diagnosis not present

## 2023-02-04 DIAGNOSIS — K219 Gastro-esophageal reflux disease without esophagitis: Secondary | ICD-10-CM | POA: Diagnosis not present

## 2023-02-04 DIAGNOSIS — E785 Hyperlipidemia, unspecified: Secondary | ICD-10-CM | POA: Diagnosis not present

## 2023-02-04 DIAGNOSIS — E1169 Type 2 diabetes mellitus with other specified complication: Secondary | ICD-10-CM | POA: Diagnosis not present

## 2023-02-04 DIAGNOSIS — Z008 Encounter for other general examination: Secondary | ICD-10-CM | POA: Diagnosis not present

## 2023-02-05 MED ORDER — SUCRALFATE 1 G PO TABS: ORAL_TABLET | ORAL | 0 refills | Status: DC

## 2023-02-05 NOTE — Telephone Encounter (Signed)
 Refill sent on 01/09/23 #90/1 refill, will refuse this request for bempedoic acid-ezetimibe .  Requested Prescriptions  Pending Prescriptions Disp Refills   sucralfate  (CARAFATE ) 1 g tablet 360 tablet 0    Sig: TAKE 1 TABLET 4 TIMES DAILY WITH MEALS AND AT BEDTIME.     Gastroenterology: Antiacids Passed - 02/05/2023  1:07 PM      Passed - Valid encounter within last 12 months    Recent Outpatient Visits           4 weeks ago Type 2 diabetes mellitus with hyperglycemia, without long-term current use of insulin [facility])   [place] [facility] Bernardo Fend, DO   2 months ago Type 2 diabetes mellitus with hyperglycemia, without long-term current use of insulin [facility])   [place] [facility] Bernardo Fend, DO   3 months ago Type 2 diabetes mellitus with hyperglycemia, without long-term current use of insulin [facility])   [place] [facility] Bernardo Fend, DO   7 months ago Hyperlipidemia, unspecified hyperlipidemia type   [facility] Bernardo Fend, DO   11 months ago Hyperlipidemia, unspecified hyperlipidemia type   [facility] Bernardo Fend, [place]              Refused Prescriptions Disp Refills   Bempedoic Acid-Ezetimibe  (NEXLIZET ) 180-10 MG TABS 90 tablet 1    Sig: Take 1 tablet by mouth daily.     Off-Protocol Failed - 02/05/2023  1:07 PM      Failed - Medication not assigned to a protocol, review manually.      Passed - Valid encounter within last 12 months    Recent Outpatient Visits           4 weeks ago Type 2 diabetes mellitus with hyperglycemia, without long-term current use of insulin [facility])   [place] [facility] Bernardo Fend, DO   2 months ago Type 2 diabetes mellitus with hyperglycemia, without long-term current use of insulin [facility])   [place] [facility] Bernardo Fend, DO   3 months ago Type 2 diabetes  mellitus with hyperglycemia, without long-term current use of insulin [facility])   [place] [facility] Bernardo Fend, DO   7 months ago Hyperlipidemia, unspecified hyperlipidemia type   [facility] Bernardo Fend, DO   11 months ago Hyperlipidemia, unspecified hyperlipidemia type   [facility] Bernardo Fend, [place]

## 2023-02-19 ENCOUNTER — Encounter: Payer: Self-pay | Admitting: Internal Medicine

## 2023-02-19 ENCOUNTER — Ambulatory Visit (INDEPENDENT_AMBULATORY_CARE_PROVIDER_SITE_OTHER): Payer: No Typology Code available for payment source | Admitting: Internal Medicine

## 2023-02-19 ENCOUNTER — Other Ambulatory Visit: Payer: Self-pay

## 2023-02-19 VITALS — BP 120/72 | HR 66 | Resp 16 | Ht 62.5 in | Wt 153.0 lb

## 2023-02-19 DIAGNOSIS — H6121 Impacted cerumen, right ear: Secondary | ICD-10-CM

## 2023-02-19 DIAGNOSIS — J0100 Acute maxillary sinusitis, unspecified: Secondary | ICD-10-CM

## 2023-02-19 MED ORDER — AMOXICILLIN-POT CLAVULANATE 875-125 MG PO TABS: 1.0000 | ORAL_TABLET | Freq: Two times a day (BID) | ORAL | 0 refills | Status: DC

## 2023-02-19 NOTE — Progress Notes (Signed)
Acute Office Visit  Subjective:     Patient ID: Kirsten Perez, female    DOB: May 15, 1940, 83 y.o.   MRN: [id number]  Chief Complaint  Patient presents with   Ear Pain    Right ear pain that radiates to jaw with pain in teeth cannot eat for 1 day    HPI Patient is in today for right ear pain.  Patient states that it started yesterday morning with intense and severe ear pain radiating into the right side of her face, right side of her jaw and teeth on the right side both top and bottom.  She denies fevers, changes in hearing, drainage from the ears.  She also denies upper respiratory symptoms including nasal congestion, sore throat as well as cough, shortness of breath or wheezing.  She tried taking Tylenol for the pain and Benadryl to help her sleep last night but did not notice any difference.   Review of Systems  Constitutional:  Negative for chills and fever.  HENT:  Positive for ear pain and sinus pain. Negative for congestion, ear discharge, hearing loss and sore throat.   Respiratory:  Negative for cough, shortness of breath and wheezing.   Cardiovascular:  Negative for chest pain.  Neurological:  Positive for headaches.        Objective:    BP 120/72 (Cuff Size: Normal)   Pulse 66   Resp 16   Ht 5' 2.5" (1.588 m)   Wt 153 lb (69.4 kg)   SpO2 98%   BMI 27.54 kg/m    Physical Exam Constitutional:      Appearance: Normal appearance.  HENT:     Head: Normocephalic and atraumatic.     Comments: Ptp over right maxillary sinus    Right Ear: Tympanic membrane, ear canal and external ear normal. There is impacted cerumen.     Left Ear: Tympanic membrane, ear canal and external ear normal.     Ears:     Comments: Right ear canal initially impacted with wax, this was successfully removed and TM normal, no infection    Nose: Nose normal.     Mouth/Throat:     Mouth: Mucous membranes are moist.     Dentition: Gingival swelling present.     Pharynx: Oropharynx is clear.      Comments: Swelling and erythema noted at the base of the bottom right first molar Eyes:     Extraocular Movements: Extraocular movements intact.     Conjunctiva/sclera: Conjunctivae normal.     Pupils: Pupils are equal, round, and reactive to light.  Neck:     Comments: Tender reactive lymphadenopathy in the right anterior cervical chain Cardiovascular:     Rate and Rhythm: Normal rate and regular rhythm.  Pulmonary:     Effort: Pulmonary effort is normal.     Breath sounds: Normal breath sounds.  Musculoskeletal:     Cervical back: Tenderness present.  Lymphadenopathy:     Cervical: Cervical adenopathy present.  Skin:    General: Skin is warm and dry.  Neurological:     General: No focal deficit present.     Mental Status: She is alert. Mental status is at baseline.  Psychiatric:        Mood and Affect: Mood normal.        Behavior: Behavior normal.     No results found for any visits on 02/19/23.      Assessment & Plan:   1. Acute non-recurrent maxillary sinusitis (Primary): Ear exam  normal, no acute otitis media but pain to palpation over right maxillary sinus with radiation to the jaw and teeth.  Will treat with Augmentin twice daily x 5 days.  On physical exam she does have erythema and swelling at the base of the right bottom first molar with no obvious signs of infection in the tooth, however I do recommend that she follow-up with her dentist for examination.  Also recommend over-the-counter decongestants to help with sinus pain/pressure and nasal steroids.  - amoxicillin-clavulanate (AUGMENTIN) 875-125 MG tablet; Take 1 tablet by mouth 2 (two) times daily for 5 days.  Dispense: 10 tablet; Refill: 0  2. Impacted cerumen of right ear: Patient consented to have earwax removed from the right ear using warm water and peroxide.  Patient tolerated procedure well.  - Ear Lavage  Return in about 5 months (around 07/20/2023).  Margarita Mail, DO

## 2023-02-24 ENCOUNTER — Other Ambulatory Visit: Payer: Self-pay | Admitting: Internal Medicine

## 2023-02-24 ENCOUNTER — Ambulatory Visit: Payer: Self-pay

## 2023-02-24 DIAGNOSIS — J0100 Acute maxillary sinusitis, unspecified: Secondary | ICD-10-CM

## 2023-02-24 MED ORDER — AMOXICILLIN-POT CLAVULANATE 875-125 MG PO TABS: 1.0000 | ORAL_TABLET | Freq: Two times a day (BID) | ORAL | 0 refills | Status: AC

## 2023-02-24 NOTE — Telephone Encounter (Signed)
Pt aware.

## 2023-02-24 NOTE — Telephone Encounter (Signed)
  Chief Complaint: Ear pain is 80% resolved - pt is requesting another rx for Augmentin Symptoms: ear pain Frequency: 1/23 Pertinent Negatives: Patient denies  Disposition: [] ED /[] Urgent Care (no appt availability in office) / [] Appointment(In office/virtual)/ []  [place] [facility]/ [] Home Care/ [] Refused Recommended Disposition /[] [place] [facility]/ [x]  Follow-up with PCP Additional Notes: Pt states that ear pain is about 80% resolved. She would like another rx for more Augmentin. Pt states that she has seen her dentist and he does not think that she has a dental infection. Please advise.  Summary: rx req / sinus discomfort   The patient would like to be prescribed an additional round of amoxicillin-clavulanate (AUGMENTIN) 875-125 MG tablet [[id number]] to help with their sinus discomfort  The patient shares that they feel better but have lingering symptoms  Please contact the patient further when possible     Reason for Disposition  [1] Prescription refill request for ESSENTIAL medicine (i.e., likelihood of harm to patient if not taken) AND [2] triager unable to refill per department policy  Answer Assessment - Initial Assessment Questions 1. DRUG NAME: "What medicine do you need to have refilled?"     Augmentin 2. REFILLS REMAINING: "How many refills are remaining?" (Note: The label on the medicine or pill bottle will show how many refills are remaining. If there are no refills remaining, then a renewal may be needed.)     0  4. PRESCRIBING HCP: "Who prescribed it?" Reason: If prescribed by specialist, call should be referred to that group.     Dr. Caralee Ates 5. SYMPTOMS: "Do you have any symptoms?"     Ear pain continues - but much improved  Protocols used: Medication Refill and Renewal Call-A-AH

## 2023-03-26 ENCOUNTER — Ambulatory Visit (INDEPENDENT_AMBULATORY_CARE_PROVIDER_SITE_OTHER): Payer: No Typology Code available for payment source

## 2023-03-26 DIAGNOSIS — Z0000 Encounter for general adult medical examination without abnormal findings: Secondary | ICD-10-CM

## 2023-03-26 NOTE — Progress Notes (Signed)
 Subjective:   Kirsten Perez is a 83 y.o. who presents for a Medicare Wellness preventive visit.  Visit Complete: Virtual I connected with  Kirsten Perez on 03/26/23 by a video and audio enabled telemedicine application and verified that I am speaking with the correct person using two identifiers.  Patient Location: Home  Provider Location: Office/Clinic  I discussed the limitations of evaluation and management by telemedicine. The patient expressed understanding and agreed to proceed.  Vital Signs: Because this visit was a virtual/telehealth visit, some criteria may be missing or patient reported. Any vitals not documented were not able to be obtained and vitals that have been documented are patient reported.   AWV Questionnaire: No: Patient Medicare AWV questionnaire was not completed prior to this visit.  Cardiac Risk Factors include: advanced age (>104men, >23 women);dyslipidemia;diabetes mellitus     Objective:    There were no vitals filed for this visit. There is no height or weight on file to calculate BMI.     03/26/2023    2:52 PM 03/20/2022    3:45 PM  Advanced Directives  Does Patient Have a Medical Advance Directive? No Yes  Type of Special educational needs teacher of [place];Living will  Copy of Healthcare Power of Attorney in Chart?  No - copy requested  Would patient like information on creating a medical advance directive? No - Patient declined     Current Medications (verified) Outpatient Encounter Medications as of 03/26/2023  Medication Sig   Bempedoic Acid-Ezetimibe (NEXLIZET) 180-10 MG TABS Take 1 tablet by mouth daily.   Continuous Glucose Sensor (FREESTYLE LIBRE 3 PLUS SENSOR) MISC Change sensor every 15 days.   famotidine (PEPCID) 20 MG tablet Take 1 tablet (20 mg total) by mouth daily.   metFORMIN (GLUCOPHAGE) 500 MG tablet Take 1 tablet (500 mg total) by mouth daily with breakfast.   Multiple Vitamin (MULTIVITAMIN) capsule Take 1 capsule by  mouth daily.   pantoprazole (PROTONIX) 40 MG tablet Take 1 tablet (40 mg total) by mouth daily.   sucralfate (CARAFATE) 1 g tablet TAKE 1 TABLET 4 TIMES DAILY WITH MEALS AND AT BEDTIME.   Evolocumab (REPATHA SURECLICK) 140 MG/ML SOAJ INJECT 140 MG UNDER THE SKIN EVERY TWO WEEKS INTO THE THIGH, STOMACH, OR UPPER ARM   No facility-administered encounter medications on file as of 03/26/2023.    Allergies (verified) Statins and Sulfa antibiotics   History: Past Medical History:  Diagnosis Date   Gastric ulcer    Hyperlipidemia    Past Surgical History:  Procedure Laterality Date   CHOLECYSTECTOMY     ROTATOR CUFF REPAIR Right    Family History  Problem Relation Age of Onset   Hypertension Mother    Stroke Mother    Heart disease Father    Social History   Socioeconomic History   Marital status: Widowed    Spouse name: Not on file   Number of children: Not on file   Years of education: Not on file   Highest education level: 12th grade  Occupational History   Not on file  Tobacco Use   Smoking status: Former    Current packs/day: 0.00    Types: Cigarettes    Quit date: 02/27/2014    Years since quitting: 9.0   Smokeless tobacco: Never   Tobacco comments:    Smoked a pack or less a week.  Vaping Use   Vaping status: Never Used  Substance and Sexual Activity   Alcohol use: Never   Drug use:  Never   Sexual activity: Not Currently  Other Topics Concern   Not on file  Social History Narrative   Not on file   Social Drivers of Health   Financial Resource Strain: Low Risk  (03/26/2023)   Overall Financial Resource Strain (CARDIA)    Difficulty of Paying Living Expenses: Not hard at all  Food Insecurity: No Food Insecurity (03/26/2023)   Hunger Vital Sign    Worried About Running Out of Food in the Last Year: Never true    Ran Out of Food in the Last Year: Never true  Transportation Needs: No Transportation Needs (03/26/2023)   PRAPARE - Scientist, research (physical sciences) (Medical): No    Lack of Transportation (Non-Medical): No  Physical Activity: Insufficiently Active (03/26/2023)   Exercise Vital Sign    Days of Exercise per Week: 3 days    Minutes of Exercise per Session: 20 min  Stress: No Stress Concern Present (03/26/2023)   Kirsten Perez of Occupational Health - Occupational Stress Questionnaire    Feeling of Stress : Not at all  Social Connections: Socially Isolated (03/26/2023)   Social Connection and Isolation Panel [NHANES]    Frequency of Communication with Friends and Family: Three times a week    Frequency of Social Gatherings with Friends and Family: Patient declined    Attends Religious Services: Never    Database administrator or Organizations: No    Attends Banker Meetings: Never    Marital Status: Widowed    Tobacco Counseling Counseling given: Not Answered Tobacco comments: Smoked a pack or less a week.    Clinical Intake:  Pre-visit preparation completed: Yes  Pain : No/denies pain     BMI - recorded: 27.5 Nutritional Status: BMI 25 -29 Overweight Nutritional Risks: None Diabetes: Yes CBG done?: No Did pt. bring in CBG monitor from home?: No  How often do you need to have someone help you when you read instructions, pamphlets, or other written materials from your doctor or pharmacy?: 1 - Never  Interpreter Needed?: No  Information entered by :: Kennedy Bucker, LPN   Activities of Daily Living     03/26/2023    2:53 PM 03/24/2023   10:55 AM  In your present state of health, do you have any difficulty performing the following activities:  Hearing? 0 0  Vision? 0 0  Difficulty concentrating or making decisions? 0 0  Walking or climbing stairs? 0 0  Dressing or bathing? 0 0  Doing errands, shopping? 0 0  Preparing Food and eating ? N N  Using the Toilet? N N  In the past six months, have you accidently leaked urine? N N  Do you have problems with loss of bowel control? N N   Managing your Medications? N N  Managing your Finances? N N  Housekeeping or managing your Housekeeping? N N    Patient Care Team: Margarita Mail, DO as PCP - General (Internal Medicine) Lockie Mola, MD as Referring Physician (Ophthalmology) [facility] [place]  Indicate any recent Medical Services you may have received from other than [facility] providers in the past year (date may be approximate).     Assessment:   This is a routine wellness examination for [place].  Hearing/Vision screen Hearing Screening - Comments:: NO AIDS Vision Screening - Comments:: WEARS CONTACTS- [facility] DR.BRAS[place]   Goals Addressed             This Visit's Progress  DIET - EAT MORE FRUITS AND VEGETABLES         Depression Screen     03/26/2023    2:50 PM 01/08/2023    1:38 PM 11/17/2022   10:22 AM 10/09/2022    3:25 PM 07/03/2022   10:46 AM 03/20/2022    3:42 PM 02/13/2022    2:14 PM  PHQ 2/9 Scores  PHQ - 2 Score 0 0 0 0 0 0 0  PHQ- 9 Score 0  0 0  0 0    Fall Risk     03/26/2023    2:53 PM 03/24/2023   10:55 AM 01/08/2023    1:38 PM 11/17/2022   10:17 AM 10/09/2022    3:25 PM  Fall Risk   Falls in the past year? 0 0 0 0 0  Number falls in past yr: 0 0 0 0 0  Injury with Fall? 0 0 0 0 0  Risk for fall due to : No Fall Risks      Follow up Falls prevention discussed;Falls evaluation completed  Falls evaluation completed      MEDICARE RISK AT HOME:  Medicare Risk at Home Any stairs in or around the home?: Yes If so, are there any without handrails?: No Home free of loose throw rugs in walkways, pet beds, electrical cords, etc?: Yes Adequate lighting in your home to reduce risk of falls?: Yes Life alert?: Yes Use of a cane, walker or w/c?: No Grab bars in the bathroom?: Yes Shower chair or bench in shower?: No Elevated toilet seat or a handicapped toilet?: No  TIMED UP AND GO:  Was the test performed?  No  Cognitive Function: 6CIT completed         03/26/2023    2:54 PM 03/20/2022    3:50 PM  6CIT Screen  What Year? 0 points 0 points  What month? 0 points 0 points  What time? 0 points 0 points  Count back from 20 0 points 0 points  Months in reverse 0 points 0 points  Repeat phrase 0 points 0 points  Total Score 0 points 0 points    Immunizations Immunization History  Administered Date(s) Administered   MODERNA COVID-19 SARS-COV-2 PEDS BIVALENT BOOSTER 22yr-9yr 03/23/2019, 04/19/2019, 12/17/2019   PNEUMOCOCCAL CONJUGATE-20 02/13/2022    Screening Tests Health Maintenance  Topic Date Due   OPHTHALMOLOGY EXAM  Never done   Zoster Vaccines- Shingrix (1 of 2) Never done   DEXA SCAN  Never done   COVID-19 Vaccine (4 - 2024-25 season) 09/28/2022   INFLUENZA VACCINE  04/27/2023 (Originally 08/28/2022)   Diabetic kidney evaluation - eGFR measurement  07/03/2023   HEMOGLOBIN A1C  07/09/2023   Diabetic kidney evaluation - Urine ACR  11/17/2023   FOOT EXAM  01/08/2024   Medicare Annual Wellness (AWV)  03/25/2024   Pneumonia Vaccine 71+ Years old  Completed   HPV VACCINES  Aged Out   DTaP/Tdap/Td  Discontinued    Health Maintenance  Health Maintenance Due  Topic Date Due   OPHTHALMOLOGY EXAM  Never done   Zoster Vaccines- Shingrix (1 of 2) Never done   DEXA SCAN  Never done   COVID-19 Vaccine (4 - 2024-25 season) 09/28/2022   Health Maintenance Items Addressed: EYE EXAM DUE - LAST ONE WAS 03/25/22 DECLINED REFERRAL FOR BONE DENSITY SCAN  Additional Screening:  Vision Screening: Recommended annual ophthalmology exams for early detection of glaucoma and other disorders of the eye.  Dental Screening: Recommended annual dental exams for  proper oral hygiene  Community Resource Referral / Chronic Care Management: CRR required this visit?  No   CCM required this visit?  No     Plan:     I have personally reviewed and noted the following in the patient's chart:   Medical and social history Use of alcohol,  tobacco or illicit drugs  Current medications and supplements including opioid prescriptions. Patient is not currently taking opioid prescriptions. Functional ability and status Nutritional status Physical activity Advanced directives List of other physicians Hospitalizations, surgeries, and ER visits in previous 12 months Vitals Screenings to include cognitive, depression, and falls Referrals and appointments  In addition, I have reviewed and discussed with patient certain preventive protocols, quality metrics, and best practice recommendations. A written personalized care plan for preventive services as well as general preventive health recommendations were provided to patient.     Hal Hope, LPN   1/61/0960   After Visit Summary: (MyChart) Due to this being a telephonic visit, the after visit summary with patients personalized plan was offered to patient via MyChart   Notes: Nothing significant to report at this time. SENT FAX TO REQUEST EYE EXAM RECORD FROM [facility]

## 2023-03-26 NOTE — Patient Instructions (Addendum)
 Kirsten Perez , Thank you for taking time to come for your Medicare Wellness Visit. I appreciate your ongoing commitment to your health goals. Please review the following plan we discussed and let me know if I can assist you in the future.   Referrals/Orders/Follow-Ups/Clinician Recommendations: NEED EYE EXAM  This is a list of the screening recommended for you and due dates:  Health Maintenance  Topic Date Due   Eye exam for diabetics  Never done   Zoster (Shingles) Vaccine (1 of 2) Never done   DEXA scan (bone density measurement)  Never done   COVID-19 Vaccine (4 - 2024-25 season) 09/28/2022   Flu Shot  04/27/2023*   Yearly kidney function blood test for diabetes  07/03/2023   Hemoglobin A1C  07/09/2023   Yearly kidney health urinalysis for diabetes  11/17/2023   Complete foot exam   01/08/2024   Medicare Annual Wellness Visit  03/25/2024   Pneumonia Vaccine  Completed   HPV Vaccine  Aged Out   DTaP/Tdap/Td vaccine  Discontinued  *Topic was postponed. The date shown is not the original due date.    Advanced directives: (ACP Link)Information on Advanced Care Planning can be found at [facility] Directives Advance Health Care Directives ([email or web address]   Next Medicare Annual Wellness Visit scheduled for next year: Yes   03/31/24 @ 3:10 PM BY VIDEO

## 2023-04-09 DIAGNOSIS — H04123 Dry eye syndrome of bilateral lacrimal glands: Secondary | ICD-10-CM | POA: Diagnosis not present

## 2023-04-09 DIAGNOSIS — H35371 Puckering of macula, right eye: Secondary | ICD-10-CM | POA: Diagnosis not present

## 2023-04-09 DIAGNOSIS — H43813 Vitreous degeneration, bilateral: Secondary | ICD-10-CM | POA: Diagnosis not present

## 2023-04-09 DIAGNOSIS — H2513 Age-related nuclear cataract, bilateral: Secondary | ICD-10-CM | POA: Diagnosis not present

## 2023-04-09 LAB — HM DIABETES EYE EXAM

## 2023-04-22 ENCOUNTER — Encounter: Payer: Self-pay | Admitting: Ophthalmology

## 2023-04-27 DIAGNOSIS — H2513 Age-related nuclear cataract, bilateral: Secondary | ICD-10-CM | POA: Diagnosis not present

## 2023-04-27 DIAGNOSIS — H2511 Age-related nuclear cataract, right eye: Secondary | ICD-10-CM | POA: Diagnosis not present

## 2023-04-29 ENCOUNTER — Other Ambulatory Visit: Payer: Self-pay | Admitting: Internal Medicine

## 2023-04-29 DIAGNOSIS — E1165 Type 2 diabetes mellitus with hyperglycemia: Secondary | ICD-10-CM

## 2023-04-29 NOTE — Telephone Encounter (Signed)
 Copied from CRM [id number]. Topic: Clinical - Medication Refill >> Apr 29, 2023 12:45 PM Higinio Roger wrote: Most Recent Primary Care Visit:  Provider: Hal Hope  Department: [facility]  Visit Type: MEDICARE AWV, SEQUENTIAL  Date: 03/26/2023  Medication: Continuous Glucose Sensor (FREESTYLE LIBRE 3 PLUS SENSOR) MISC   Has the patient contacted their pharmacy? Yes (Agent: If no, request that the patient contact the pharmacy for the refill. If patient does not wish to contact the pharmacy document the reason why and proceed with request.) (Agent: If yes, when and what did the pharmacy advise?)  Is this the correct pharmacy for this prescription? Yes If no, delete pharmacy and type the correct one.  This is the patient's preferred pharmacy:   [id number] [place], [place] - [address] [place] [address] Phone: [phone] Fax: [phone]   Has the prescription been filled recently? Yes  Is the patient out of the medication? Yes  Has the patient been seen for an appointment in the last year OR does the patient have an upcoming appointment? Yes  Can we respond through MyChart? No. Call [phone]  Agent: Please be advised that Rx refills may take up to 3 business days. We ask that you follow-up with your pharmacy.

## 2023-04-30 NOTE — Telephone Encounter (Signed)
 Requested medication (s) are due for refill today: yes  Requested medication (s) are on the active medication list: yes  Last refill:  01/08/23 #6/1  Future visit scheduled: yes  Notes to clinic:  no protocol will attach for review. Please advise for refill      Requested Prescriptions  Pending Prescriptions Disp Refills   Continuous Glucose Sensor (FREESTYLE LIBRE 3 PLUS SENSOR) MISC 6 each 1    Sig: Change sensor every 15 days.     There is no refill protocol information for this order

## 2023-05-01 MED ORDER — FREESTYLE LIBRE 3 PLUS SENSOR MISC: 1 refills | Status: DC

## 2023-05-04 NOTE — Discharge Instructions (Signed)

## 2023-05-06 ENCOUNTER — Ambulatory Visit: Payer: Self-pay | Admitting: Anesthesiology

## 2023-05-06 ENCOUNTER — Ambulatory Visit
Admission: RE | Admit: 2023-05-06 | Discharge: 2023-05-06 | Disposition: A | Discharge status: Home / Self Care | Attending: Ophthalmology | Admitting: Ophthalmology

## 2023-05-06 ENCOUNTER — Encounter
Admission: RE | Disposition: A | Discharge status: Home / Self Care | Payer: Self-pay | Source: Home / Self Care | Attending: Ophthalmology

## 2023-05-06 ENCOUNTER — Encounter: Payer: Self-pay | Admitting: Ophthalmology

## 2023-05-06 ENCOUNTER — Other Ambulatory Visit: Payer: Self-pay

## 2023-05-06 DIAGNOSIS — E1136 Type 2 diabetes mellitus with diabetic cataract: Secondary | ICD-10-CM | POA: Insufficient documentation

## 2023-05-06 DIAGNOSIS — M1990 Unspecified osteoarthritis, unspecified site: Secondary | ICD-10-CM | POA: Insufficient documentation

## 2023-05-06 DIAGNOSIS — Z7984 Long term (current) use of oral hypoglycemic drugs: Secondary | ICD-10-CM | POA: Diagnosis not present

## 2023-05-06 DIAGNOSIS — E785 Hyperlipidemia, unspecified: Secondary | ICD-10-CM | POA: Diagnosis not present

## 2023-05-06 DIAGNOSIS — H2511 Age-related nuclear cataract, right eye: Secondary | ICD-10-CM | POA: Insufficient documentation

## 2023-05-06 DIAGNOSIS — Z8711 Personal history of peptic ulcer disease: Secondary | ICD-10-CM | POA: Insufficient documentation

## 2023-05-06 DIAGNOSIS — Z87891 Personal history of nicotine dependence: Secondary | ICD-10-CM | POA: Diagnosis not present

## 2023-05-06 HISTORY — DX: Atherosclerosis of aorta: I70.0

## 2023-05-06 HISTORY — DX: Atopic dermatitis, unspecified: L20.9

## 2023-05-06 HISTORY — DX: Unspecified osteoarthritis, unspecified site: M19.90

## 2023-05-06 HISTORY — DX: Presence of spectacles and contact lenses: Z97.3

## 2023-05-06 HISTORY — PX: CATARACT EXTRACTION W/PHACO: SHX586

## 2023-05-06 HISTORY — DX: Type 2 diabetes mellitus without complications: E11.9

## 2023-05-06 SURGERY — PHACOEMULSIFICATION, CATARACT, WITH IOL INSERTION
Anesthesia: Monitor Anesthesia Care | Site: Eye | Laterality: Right

## 2023-05-06 MED ORDER — SIGHTPATH DOSE#1 BSS IO SOLN
INTRAOCULAR | Status: DC | PRN
  Administered 2023-05-06: 15 mL via INTRAOCULAR

## 2023-05-06 MED ORDER — SIGHTPATH DOSE#1 NA HYALUR & NA CHOND-NA HYALUR IO KIT
PACK | INTRAOCULAR | Status: DC | PRN
  Administered 2023-05-06: 1 via OPHTHALMIC

## 2023-05-06 MED ORDER — MIDAZOLAM HCL 2 MG/2ML IJ SOLN
INTRAMUSCULAR | Status: AC
  Filled 2023-05-06: qty 2

## 2023-05-06 MED ORDER — ARMC OPHTHALMIC DILATING DROPS
1.0000 | OPHTHALMIC | Status: DC | PRN
  Administered 2023-05-06 (×3): 1 via OPHTHALMIC

## 2023-05-06 MED ORDER — ARMC OPHTHALMIC DILATING DROPS
OPHTHALMIC | Status: AC
  Filled 2023-05-06: qty 0.5

## 2023-05-06 MED ORDER — FENTANYL CITRATE (PF) 100 MCG/2ML IJ SOLN
INTRAMUSCULAR | Status: DC | PRN
  Administered 2023-05-06: 50 ug via INTRAVENOUS

## 2023-05-06 MED ORDER — CEFUROXIME OPHTHALMIC INJECTION 1 MG/0.1 ML
INJECTION | OPHTHALMIC | Status: DC | PRN
  Administered 2023-05-06: 1 mg via INTRACAMERAL

## 2023-05-06 MED ORDER — TETRACAINE HCL 0.5 % OP SOLN
1.0000 [drp] | OPHTHALMIC | Status: DC | PRN
  Administered 2023-05-06 (×3): 1 [drp] via OPHTHALMIC

## 2023-05-06 MED ORDER — LIDOCAINE HCL (PF) 2 % IJ SOLN
INTRAOCULAR | Status: DC | PRN
  Administered 2023-05-06: 2 mL

## 2023-05-06 MED ORDER — TETRACAINE HCL 0.5 % OP SOLN
OPHTHALMIC | Status: AC
  Filled 2023-05-06: qty 4

## 2023-05-06 MED ORDER — NEOMYCIN-POLYMYXIN-DEXAMETH 3.5-10000-0.1 OP OINT
TOPICAL_OINTMENT | OPHTHALMIC | Status: DC | PRN
  Administered 2023-05-06: 1 via OPHTHALMIC

## 2023-05-06 MED ORDER — BRIMONIDINE TARTRATE-TIMOLOL 0.2-0.5 % OP SOLN
OPHTHALMIC | Status: DC | PRN
  Administered 2023-05-06: 1 [drp] via OPHTHALMIC

## 2023-05-06 MED ORDER — SIGHTPATH DOSE#1 BSS IO SOLN
INTRAOCULAR | Status: DC | PRN
  Administered 2023-05-06: 61 mL via OPHTHALMIC

## 2023-05-06 MED ORDER — FENTANYL CITRATE (PF) 100 MCG/2ML IJ SOLN
INTRAMUSCULAR | Status: AC
Start: 2023-05-06 — End: ?
  Filled 2023-05-06: qty 2

## 2023-05-06 MED ORDER — MIDAZOLAM HCL 2 MG/2ML IJ SOLN
INTRAMUSCULAR | Status: DC | PRN
  Administered 2023-05-06: 1 mg via INTRAVENOUS

## 2023-05-06 SURGICAL SUPPLY — 10 items
CATARACT SUITE SIGHTPATH (MISCELLANEOUS) ×1 IMPLANT
FEE CATARACT SUITE SIGHTPATH (MISCELLANEOUS) ×1 IMPLANT
GLOVE BIOGEL PI IND STRL 8 (GLOVE) ×1 IMPLANT
GLOVE SURG LX STRL 7.5 STRW (GLOVE) ×1 IMPLANT
GLOVE SURG PROTEXIS BL SZ6.5 (GLOVE) ×1 IMPLANT
GLOVE SURG SYN 6.5 PF PI BL (GLOVE) ×1 IMPLANT
LENS IOL TECNIS EYHANCE 9.0 (Intraocular Lens) IMPLANT
NDL FILTER BLUNT 18X1 1/2 (NEEDLE) ×1 IMPLANT
NEEDLE FILTER BLUNT 18X1 1/2 (NEEDLE) ×1 IMPLANT
SYR 3ML LL SCALE MARK (SYRINGE) ×1 IMPLANT

## 2023-05-06 NOTE — Anesthesia Preprocedure Evaluation (Addendum)
 Anesthesia Evaluation  Patient identified by MRN, date of birth, ID band Patient awake    Reviewed: Allergy & Precautions, H&P , NPO status , Patient's Chart, lab work & pertinent test results  Airway Mallampati: III  TM Distance: <3 FB Neck ROM: Full    Dental no notable dental hx.    Pulmonary former smoker   Pulmonary exam normal breath sounds clear to auscultation       Cardiovascular negative cardio ROS Normal cardiovascular exam Rhythm:Regular Rate:Normal     Neuro/Psych negative neurological ROS  negative psych ROS   GI/Hepatic negative GI ROS, Neg liver ROS, PUD,,,  Endo/Other  diabetes    Renal/GU negative Renal ROS  negative genitourinary   Musculoskeletal negative musculoskeletal ROS (+) Arthritis ,    Abdominal   Peds negative pediatric ROS (+)  Hematology negative hematology ROS (+)   Anesthesia Other Findings Hyperlipidemia  Gastric ulcer Diabetes mellitus without complication Arthritis Wears contact lens     Reproductive/Obstetrics negative OB ROS                             Anesthesia Physical Anesthesia Plan  ASA: 3  Anesthesia Plan: MAC   Post-op Pain Management:    Induction: Intravenous  PONV Risk Score and Plan:   Airway Management Planned: Natural Airway and Nasal Cannula  Additional Equipment:   Intra-op Plan:   Post-operative Plan:   Informed Consent: I have reviewed the patients History and Physical, chart, labs and discussed the procedure including the risks, benefits and alternatives for the proposed anesthesia with the patient or authorized representative who has indicated his/her understanding and acceptance.     Dental Advisory Given  Plan Discussed with: Anesthesiologist, CRNA and Surgeon  Anesthesia Plan Comments: (Patient consented for risks of anesthesia including but not limited to:  - adverse reactions to medications - damage  to eyes, teeth, lips or other oral mucosa - nerve damage due to positioning  - sore throat or hoarseness - Damage to heart, brain, nerves, lungs, other parts of body or loss of life  Patient voiced understanding and assent.)       Anesthesia Quick Evaluation

## 2023-05-06 NOTE — Anesthesia Postprocedure Evaluation (Signed)
 Anesthesia Post Note  Patient: Kirsten Perez  Procedure(s) Performed: PHACOEMULSIFICATION, CATARACT, WITH IOL INSERTION 7.31 00:35.3 (Right: Eye)  Patient location during evaluation: PACU Anesthesia Type: MAC Level of consciousness: awake and alert Pain management: pain level controlled Vital Signs Assessment: post-procedure vital signs reviewed and stable Respiratory status: spontaneous breathing, nonlabored ventilation, respiratory function stable and patient connected to nasal cannula oxygen Cardiovascular status: stable and blood pressure returned to baseline Postop Assessment: no apparent nausea or vomiting Anesthetic complications: no   No notable events documented.   Last Vitals:  Vitals:   05/06/23 1034 05/06/23 1039  BP: 134/71 129/70  Pulse: 62 65  Resp: 13 14  Temp: (!) 36.2 C (!) 36.2 C  SpO2: 92% 92%    Last Pain:  Vitals:   05/06/23 1039  TempSrc:   PainSc: 0-No pain                 Bowe Sidor C Jj Enyeart

## 2023-05-06 NOTE — Op Note (Signed)
 LOCATION:  [facility]   PREOPERATIVE DIAGNOSIS:    Nuclear sclerotic cataract right eye. H25.11   POSTOPERATIVE DIAGNOSIS:  Nuclear sclerotic cataract right eye.     PROCEDURE:  Phacoemusification with posterior chamber intraocular lens placement of the right eye   ULTRASOUND TIME: Procedure(s): PHACOEMULSIFICATION, CATARACT, WITH IOL INSERTION 7.31 00:35.3 (Right)  LENS:   Implant Name Type Inv. Item Serial No. Manufacturer Lot No. LRB No. Used Action  LENS IOL TECNIS EYHANCE 9.0 - [id number] Intraocular Lens LENS IOL TECNIS EYHANCE 9.0 [id number] [employer]  Right 1 Implanted         SURGEON:  Deirdre Evener, MD   ANESTHESIA:  Topical with tetracaine drops and 2% Xylocaine jelly, augmented with 1% preservative-free intracameral lidocaine.    COMPLICATIONS:  None.   DESCRIPTION OF PROCEDURE:  The patient was identified in the holding room and transported to the operating room and placed in the supine position under the operating microscope.  The right eye was identified as the operative eye and it was prepped and draped in the usual sterile ophthalmic fashion.   A 1 millimeter clear-corneal paracentesis was made at the 12:00 position.  0.5 ml of preservative-free 1% lidocaine was injected into the anterior chamber. The anterior chamber was filled with Viscoat viscoelastic.  A 2.4 millimeter keratome was used to make a near-clear corneal incision at the 9:00 position.  A curvilinear capsulorrhexis was made with a cystotome and capsulorrhexis forceps.  Balanced salt solution was used to hydrodissect and hydrodelineate the nucleus.   Phacoemulsification was then used in stop and chop fashion to remove the lens nucleus and epinucleus.  The remaining cortex was then removed using the irrigation and aspiration handpiece. Provisc was then placed into the capsular bag to distend it for lens placement.  A lens was then injected into the capsular bag.  The remaining  viscoelastic was aspirated.   Wounds were hydrated with balanced salt solution.  The anterior chamber was inflated to a physiologic pressure with balanced salt solution.  No wound leaks were noted. Cefuroxime 0.1 ml of a 10mg /ml solution was injected into the anterior chamber for a dose of 1 mg of intracameral antibiotic at the completion of the case.   Timolol and Brimonidine drops were applied to the eye.  The patient was taken to the recovery room in stable condition without complications of anesthesia or surgery.   Kirsten Perez 05/06/2023, 10:32 AM

## 2023-05-06 NOTE — H&P (Signed)
 [facility]   Primary Care Physician:  Margarita Mail, DO Ophthalmologist: Dr. Lockie Mola  Pre-Procedure History & Physical: HPI:  Kirsten Perez is a 83 y.o. female here for ophthalmic surgery.   Past Medical History:  Diagnosis Date   Aortic atherosclerosis (HCC)    Arthritis    Atopic dermatitis    Diabetes mellitus without complication (HCC)    Gastric ulcer    Hyperlipidemia    Wears contact lenses     Past Surgical History:  Procedure Laterality Date   CHOLECYSTECTOMY     ROTATOR CUFF REPAIR Right     Prior to Admission medications   Medication Sig Start Date End Date Taking? Authorizing Provider  Bempedoic Acid-Ezetimibe (NEXLIZET) 180-10 MG TABS Take 1 tablet by mouth daily. 01/09/23  Yes Margarita Mail, DO  famotidine (PEPCID) 20 MG tablet Take 1 tablet (20 mg total) by mouth daily. 11/17/22  Yes Margarita Mail, DO  metFORMIN (GLUCOPHAGE) 500 MG tablet Take 1 tablet (500 mg total) by mouth daily with breakfast. 01/09/23  Yes Margarita Mail, DO  Multiple Vitamin (MULTIVITAMIN) capsule Take 1 capsule by mouth daily.   Yes [provider]  pantoprazole (PROTONIX) 40 MG tablet Take 1 tablet (40 mg total) by mouth daily. 01/08/23  Yes Margarita Mail, DO  sucralfate (CARAFATE) 1 g tablet TAKE 1 TABLET 4 TIMES DAILY WITH MEALS AND AT BEDTIME. 02/05/23  Yes Margarita Mail, DO  Continuous Glucose Sensor (FREESTYLE LIBRE 3 PLUS SENSOR) MISC Change sensor every 15 days. 05/01/23   Margarita Mail, DO  Evolocumab (REPATHA SURECLICK) 140 MG/ML SOAJ INJECT 140 MG UNDER THE SKIN EVERY TWO WEEKS INTO THE THIGH, STOMACH, OR UPPER ARM Patient not taking: Reported on 04/22/2023 08/19/22   Margarita Mail, DO    Allergies as of 04/13/2023 - Review Complete 03/26/2023  Allergen Reaction Noted   Statins  03/04/2021   Sulfa antibiotics  03/04/2021    Family History  Problem Relation Age of Onset   Hypertension Mother    Stroke Mother     Heart disease Father     Social History   Socioeconomic History   Marital status: Widowed    Spouse name: Not on file   Number of children: Not on file   Years of education: Not on file   Highest education level: 12th grade  Occupational History   Not on file  Tobacco Use   Smoking status: Former    Current packs/day: 0.00    Types: Cigarettes    Quit date: 02/27/2014    Years since quitting: 9.1   Smokeless tobacco: Never   Tobacco comments:    Smoked a pack or less a week.  Vaping Use   Vaping status: Never Used  Substance and Sexual Activity   Alcohol use: Never   Drug use: Never   Sexual activity: Not Currently  Other Topics Concern   Not on file  Social History Narrative   Not on file   Social Drivers of Health   Financial Resource Strain: Low Risk  (03/26/2023)   Overall Financial Resource Strain (CARDIA)    Difficulty of Paying Living Expenses: Not hard at all  Food Insecurity: No Food Insecurity (03/26/2023)   Hunger Vital Sign    Worried About Running Out of Food in the Last Year: Never true    Ran Out of Food in the Last Year: Never true  Transportation Needs: No Transportation Needs (03/26/2023)   PRAPARE - Administrator, Civil Service (Medical): No  Lack of Transportation (Non-Medical): No  Physical Activity: Insufficiently Active (03/26/2023)   Exercise Vital Sign    Days of Exercise per Week: 3 days    Minutes of Exercise per Session: 20 min  Stress: No Stress Concern Present (03/26/2023)   [employer] of Occupational Health - Occupational Stress Questionnaire    Feeling of Stress : Not at all  Social Connections: Socially Isolated (03/26/2023)   Social Connection and Isolation Panel [NHANES]    Frequency of Communication with Friends and Family: Three times a week    Frequency of Social Gatherings with Friends and Family: Patient declined    Attends Religious Services: Never    Database administrator or Organizations: No     Attends Banker Meetings: Never    Marital Status: Widowed  Intimate Partner Violence: Not At Risk (03/26/2023)   Humiliation, Afraid, Rape, and Kick questionnaire    Fear of Current or Ex-Partner: No    Emotionally Abused: No    Physically Abused: No    Sexually Abused: No    Review of Systems: See HPI, otherwise negative ROS  Physical Exam: BP (!) 149/76   Pulse 64   Temp (!) 97.3 F (36.3 C) (Temporal)   Ht 5' 2.5" (1.588 m)   Wt 67.1 kg   SpO2 94%   BMI 26.64 kg/m  General:   Alert,  pleasant and cooperative in NAD Head:  Normocephalic and atraumatic. Lungs:  Clear to auscultation.    Heart:  Regular rate and rhythm.   Impression/Plan: Kirsten Perez is here for ophthalmic surgery.  Risks, benefits, limitations, and alternatives regarding ophthalmic surgery have been reviewed with the patient.  Questions have been answered.  All parties agreeable.   Lockie Mola, MD  05/06/2023, 9:54 AM

## 2023-05-06 NOTE — Transfer of Care (Signed)
 Immediate Anesthesia Transfer of Care Note  Patient: Kirsten Perez  Procedure(s) Performed: PHACOEMULSIFICATION, CATARACT, WITH IOL INSERTION 7.31 00:35.3 (Right: Eye)  Patient Location: PACU  Anesthesia Type: MAC  Level of Consciousness: awake, alert  and patient cooperative  Airway and Oxygen Therapy: Patient Spontanous Breathing and Patient connected to supplemental oxygen  Post-op Assessment: Post-op Vital signs reviewed, Patient's Cardiovascular Status Stable, Respiratory Function Stable, Patent Airway and No signs of Nausea or vomiting  Post-op Vital Signs: Reviewed and stable  Complications: No notable events documented.

## 2023-05-07 ENCOUNTER — Encounter: Payer: Self-pay | Admitting: Ophthalmology

## 2023-05-07 LAB — GLUCOSE, CAPILLARY: Glucose-Capillary: 115 mg/dL — ABNORMAL HIGH (ref 70–99)

## 2023-05-08 NOTE — Anesthesia Preprocedure Evaluation (Addendum)
 Anesthesia Evaluation  Patient identified by MRN, date of birth, ID band Patient awake    Reviewed: Allergy & Precautions, H&P , NPO status , Patient's Chart, lab work & pertinent test results  Airway Mallampati: III  TM Distance: <3 FB Neck ROM: Full    Dental no notable dental hx.    Pulmonary former smoker   Pulmonary exam normal breath sounds clear to auscultation       Cardiovascular negative cardio ROS Normal cardiovascular exam Rhythm:Regular Rate:Normal     Neuro/Psych negative neurological ROS  negative psych ROS   GI/Hepatic negative GI ROS, Neg liver ROS, PUD,,,  Endo/Other  diabetes    Renal/GU negative Renal ROS  negative genitourinary   Musculoskeletal negative musculoskeletal ROS (+) Arthritis ,    Abdominal   Peds negative pediatric ROS (+)  Hematology negative hematology ROS (+)   Anesthesia Other Findings Previous cataract surgery 05-06-23 Dr. Aldo Amble  Didn't sleep well last night, is very anxious and "uptight" today and requests preop versed , so will administer, place on oxygen per nasal cannula and pulse oximetry Hyperlipidemia  Gastric ulcer Diabetes mellitus without complication   Arthritis Wears contact lenses  Aortic atherosclerosis  Atopic dermatitis     Reproductive/Obstetrics negative OB ROS                             Anesthesia Physical Anesthesia Plan  ASA: 3  Anesthesia Plan: MAC   Post-op Pain Management:    Induction: Intravenous  PONV Risk Score and Plan:   Airway Management Planned: Natural Airway and Nasal Cannula  Additional Equipment:   Intra-op Plan:   Post-operative Plan:   Informed Consent: I have reviewed the patients History and Physical, chart, labs and discussed the procedure including the risks, benefits and alternatives for the proposed anesthesia with the patient or authorized representative who has indicated his/her  understanding and acceptance.     Dental Advisory Given  Plan Discussed with: Anesthesiologist, CRNA and Surgeon  Anesthesia Plan Comments: (Patient consented for risks of anesthesia including but not limited to:  - adverse reactions to medications - damage to eyes, teeth, lips or other oral mucosa - nerve damage due to positioning  - sore throat or hoarseness - Damage to heart, brain, nerves, lungs, other parts of body or loss of life  Patient voiced understanding and assent.)        Anesthesia Quick Evaluation

## 2023-05-15 ENCOUNTER — Other Ambulatory Visit: Payer: Self-pay | Admitting: Internal Medicine

## 2023-05-15 DIAGNOSIS — Z8711 Personal history of peptic ulcer disease: Secondary | ICD-10-CM

## 2023-05-15 NOTE — Telephone Encounter (Signed)
 Requested Prescriptions  Pending Prescriptions Disp Refills   famotidine  (PEPCID ) 20 MG tablet [Pharmacy Med Name: FAMOTIDINE  20 MG TAB[*]] 90 tablet 0    Sig: TAKE ONE TABLET BY MOUTH ONE TIME DAILY     Gastroenterology:  H2 Antagonists Passed - 05/15/2023  4:00 PM      Passed - Valid encounter within last 12 months    Recent Outpatient Visits   None

## 2023-05-15 NOTE — Telephone Encounter (Signed)
 Copied from CRM [id number]. Topic: Clinical - Medication Refill >> May 15, 2023 12:25 PM Alpha Arts wrote: Most Recent Primary Care Visit:  Provider: Pinky Bright  Department: [facility]  Visit Type: MEDICARE AWV, SEQUENTIAL  Date: 03/26/2023  Medication: famotidine  (PEPCID ) 20 MG tablet   Has the patient contacted their pharmacy? Yes (Agent: If no, request that the patient contact the pharmacy for the refill. If patient does not wish to contact the pharmacy document the reason why and proceed with request.) (Agent: If yes, when and what did the pharmacy advise?)  Is this the correct pharmacy for this prescription? Yes If no, delete pharmacy and type the correct one.  This is the patient's preferred pharmacy:   [facility] [address] [facility] - [place], [place] - [address] [place] [address] Phone: [phone] Fax: [phone]    Has the prescription been filled recently? Yes  Is the patient out of the medication? No  Has the patient been seen for an appointment in the last year OR does the patient have an upcoming appointment? Yes  Can we respond through MyChart? Yes  Agent: Please be advised that Rx refills may take up to 3 business days. We ask that you follow-up with your pharmacy.

## 2023-05-19 NOTE — Discharge Instructions (Signed)

## 2023-05-20 ENCOUNTER — Ambulatory Visit: Payer: Self-pay | Admitting: Anesthesiology

## 2023-05-20 ENCOUNTER — Ambulatory Visit
Admission: RE | Admit: 2023-05-20 | Discharge: 2023-05-20 | Disposition: A | Discharge status: Home / Self Care | Attending: Ophthalmology | Admitting: Ophthalmology

## 2023-05-20 ENCOUNTER — Other Ambulatory Visit: Payer: Self-pay

## 2023-05-20 ENCOUNTER — Encounter
Admission: RE | Disposition: A | Discharge status: Home / Self Care | Payer: Self-pay | Source: Home / Self Care | Attending: Ophthalmology

## 2023-05-20 DIAGNOSIS — E1136 Type 2 diabetes mellitus with diabetic cataract: Secondary | ICD-10-CM | POA: Diagnosis not present

## 2023-05-20 DIAGNOSIS — E785 Hyperlipidemia, unspecified: Secondary | ICD-10-CM | POA: Diagnosis not present

## 2023-05-20 DIAGNOSIS — Z79899 Other long term (current) drug therapy: Secondary | ICD-10-CM | POA: Diagnosis not present

## 2023-05-20 DIAGNOSIS — Z87891 Personal history of nicotine dependence: Secondary | ICD-10-CM | POA: Insufficient documentation

## 2023-05-20 DIAGNOSIS — H2512 Age-related nuclear cataract, left eye: Secondary | ICD-10-CM | POA: Diagnosis not present

## 2023-05-20 DIAGNOSIS — Z961 Presence of intraocular lens: Secondary | ICD-10-CM | POA: Insufficient documentation

## 2023-05-20 DIAGNOSIS — Z7984 Long term (current) use of oral hypoglycemic drugs: Secondary | ICD-10-CM | POA: Insufficient documentation

## 2023-05-20 DIAGNOSIS — K279 Peptic ulcer, site unspecified, unspecified as acute or chronic, without hemorrhage or perforation: Secondary | ICD-10-CM | POA: Diagnosis not present

## 2023-05-20 DIAGNOSIS — M1990 Unspecified osteoarthritis, unspecified site: Secondary | ICD-10-CM | POA: Diagnosis not present

## 2023-05-20 DIAGNOSIS — Z9841 Cataract extraction status, right eye: Secondary | ICD-10-CM | POA: Diagnosis not present

## 2023-05-20 HISTORY — PX: CATARACT EXTRACTION W/PHACO: SHX586

## 2023-05-20 LAB — GLUCOSE, CAPILLARY: Glucose-Capillary: 96 mg/dL (ref 70–99)

## 2023-05-20 SURGERY — PHACOEMULSIFICATION, CATARACT, WITH IOL INSERTION
Anesthesia: Monitor Anesthesia Care | Site: Eye | Laterality: Left

## 2023-05-20 MED ORDER — TETRACAINE HCL 0.5 % OP SOLN
1.0000 [drp] | OPHTHALMIC | Status: DC | PRN
  Administered 2023-05-20 (×3): 1 [drp] via OPHTHALMIC

## 2023-05-20 MED ORDER — SIGHTPATH DOSE#1 NA HYALUR & NA CHOND-NA HYALUR IO KIT
PACK | INTRAOCULAR | Status: DC | PRN
  Administered 2023-05-20: 1 via OPHTHALMIC

## 2023-05-20 MED ORDER — LIDOCAINE HCL (PF) 2 % IJ SOLN
INTRAOCULAR | Status: DC | PRN
  Administered 2023-05-20: 2 mL

## 2023-05-20 MED ORDER — ARMC OPHTHALMIC DILATING DROPS
1.0000 | OPHTHALMIC | Status: DC | PRN
  Administered 2023-05-20 (×3): 1 via OPHTHALMIC

## 2023-05-20 MED ORDER — FENTANYL CITRATE (PF) 100 MCG/2ML IJ SOLN
INTRAMUSCULAR | Status: AC
  Filled 2023-05-20: qty 2

## 2023-05-20 MED ORDER — SIGHTPATH DOSE#1 BSS IO SOLN
INTRAOCULAR | Status: DC | PRN
  Administered 2023-05-20: 87 mL via OPHTHALMIC

## 2023-05-20 MED ORDER — SIGHTPATH DOSE#1 BSS IO SOLN
INTRAOCULAR | Status: DC | PRN
  Administered 2023-05-20: 15 mL via INTRAOCULAR

## 2023-05-20 MED ORDER — FENTANYL CITRATE (PF) 100 MCG/2ML IJ SOLN
INTRAMUSCULAR | Status: DC | PRN
  Administered 2023-05-20 (×2): 25 ug via INTRAVENOUS

## 2023-05-20 MED ORDER — MIDAZOLAM HCL 2 MG/2ML IJ SOLN
2.0000 mg | Freq: Once | INTRAMUSCULAR | Status: AC
  Administered 2023-05-20: 2 mg via INTRAVENOUS

## 2023-05-20 MED ORDER — CEFUROXIME OPHTHALMIC INJECTION 1 MG/0.1 ML
INJECTION | OPHTHALMIC | Status: DC | PRN
  Administered 2023-05-20: 1 mg via INTRACAMERAL

## 2023-05-20 MED ORDER — ARMC OPHTHALMIC DILATING DROPS
OPHTHALMIC | Status: AC
  Filled 2023-05-20: qty 0.5

## 2023-05-20 MED ORDER — TETRACAINE HCL 0.5 % OP SOLN
OPHTHALMIC | Status: AC
  Filled 2023-05-20: qty 4

## 2023-05-20 MED ORDER — BRIMONIDINE TARTRATE-TIMOLOL 0.2-0.5 % OP SOLN
OPHTHALMIC | Status: DC | PRN
  Administered 2023-05-20: 1 [drp] via OPHTHALMIC

## 2023-05-20 MED ORDER — MIDAZOLAM HCL 2 MG/2ML IJ SOLN
INTRAMUSCULAR | Status: AC
  Filled 2023-05-20: qty 2

## 2023-05-20 SURGICAL SUPPLY — 10 items
CATARACT SUITE SIGHTPATH (MISCELLANEOUS) ×1 IMPLANT
FEE CATARACT SUITE SIGHTPATH (MISCELLANEOUS) ×1 IMPLANT
GLOVE BIOGEL PI IND STRL 8 (GLOVE) ×1 IMPLANT
GLOVE SURG LX STRL 7.5 STRW (GLOVE) ×1 IMPLANT
GLOVE SURG PROTEXIS BL SZ6.5 (GLOVE) ×1 IMPLANT
GLOVE SURG SYN 6.5 PF PI BL (GLOVE) ×1 IMPLANT
LENS IOL TECNIS EYHANCE 9.5 (Intraocular Lens) IMPLANT
NDL FILTER BLUNT 18X1 1/2 (NEEDLE) ×1 IMPLANT
NEEDLE FILTER BLUNT 18X1 1/2 (NEEDLE) ×1 IMPLANT
SYR 3ML LL SCALE MARK (SYRINGE) ×1 IMPLANT

## 2023-05-20 NOTE — Anesthesia Postprocedure Evaluation (Signed)
 Anesthesia Post Note  Patient: Kirsten Perez  Procedure(s) Performed: PHACOEMULSIFICATION, CATARACT, WITH IOL INSERTION 5.32 00:34.5 (Left: Eye)  Patient location during evaluation: PACU Anesthesia Type: MAC Level of consciousness: awake and alert Pain management: pain level controlled Vital Signs Assessment: post-procedure vital signs reviewed and stable Respiratory status: spontaneous breathing, nonlabored ventilation, respiratory function stable and patient connected to nasal cannula oxygen Cardiovascular status: stable and blood pressure returned to baseline Postop Assessment: no apparent nausea or vomiting Anesthetic complications: no   No notable events documented.   Last Vitals:  Vitals:   05/20/23 0931 05/20/23 0935  BP: (!) 147/72 126/74  Pulse:  (!) 56  Resp: 12 14  Temp: (!) 36.4 C (!) 36.4 C  SpO2:  98%    Last Pain:  Vitals:   05/20/23 0935  TempSrc:   PainSc: 0-No pain                 Teran Knittle C Amore Ackman

## 2023-05-20 NOTE — H&P (Signed)
 [facility]   Primary Care Physician:  Rockney Cid, DO Ophthalmologist: Dr. Annell Kidney  Pre-Procedure History & Physical: HPI:  Kirsten Perez is a 83 y.o. female here for ophthalmic surgery.   Past Medical History:  Diagnosis Date   Aortic atherosclerosis (HCC)    Arthritis    Atopic dermatitis    Diabetes mellitus without complication (HCC)    Gastric ulcer    Hyperlipidemia    Wears contact lenses     Past Surgical History:  Procedure Laterality Date   CATARACT EXTRACTION W/PHACO Right 05/06/2023   Procedure: PHACOEMULSIFICATION, CATARACT, WITH IOL INSERTION 7.31 00:35.3;  Surgeon: Annell Kidney, MD;  Location: [facility];  Service: Ophthalmology;  Laterality: Right;   CHOLECYSTECTOMY     ROTATOR CUFF REPAIR Right     Prior to Admission medications   Medication Sig Start Date End Date Taking? Authorizing Provider  Bempedoic Acid-Ezetimibe (NEXLIZET ) 180-10 MG TABS Take 1 tablet by mouth daily. 01/09/23  Yes Rockney Cid, DO  famotidine  (PEPCID ) 20 MG tablet TAKE ONE TABLET BY MOUTH ONE TIME DAILY 05/15/23  Yes Rockney Cid, DO  metFORMIN  (GLUCOPHAGE ) 500 MG tablet Take 1 tablet (500 mg total) by mouth daily with breakfast. 01/09/23  Yes Rockney Cid, DO  Multiple Vitamin (MULTIVITAMIN) capsule Take 1 capsule by mouth daily.   Yes [provider]  pantoprazole  (PROTONIX ) 40 MG tablet Take 1 tablet (40 mg total) by mouth daily. 01/08/23  Yes Rockney Cid, DO  sucralfate  (CARAFATE ) 1 g tablet TAKE 1 TABLET 4 TIMES DAILY WITH MEALS AND AT BEDTIME. 02/05/23  Yes Rockney Cid, DO  Continuous Glucose Sensor (FREESTYLE LIBRE 3 PLUS SENSOR) MISC Change sensor every 15 days. 05/01/23   Rockney Cid, DO  Evolocumab  (REPATHA  SURECLICK) 140 MG/ML SOAJ INJECT 140 MG UNDER THE SKIN EVERY TWO WEEKS INTO THE THIGH, STOMACH, OR UPPER ARM Patient not taking: Reported on 04/22/2023 08/19/22   Rockney Cid, DO     Allergies as of 04/13/2023 - Review Complete 03/26/2023  Allergen Reaction Noted   Statins  03/04/2021   Sulfa antibiotics  03/04/2021    Family History  Problem Relation Age of Onset   Hypertension Mother    Stroke Mother    Heart disease Father     Social History   Socioeconomic History   Marital status: Widowed    Spouse name: Not on file   Number of children: Not on file   Years of education: Not on file   Highest education level: 12th grade  Occupational History   Not on file  Tobacco Use   Smoking status: Former    Current packs/day: 0.00    Types: Cigarettes    Quit date: 02/27/2014    Years since quitting: 9.2   Smokeless tobacco: Never   Tobacco comments:    Smoked a pack or less a week.  Vaping Use   Vaping status: Never Used  Substance and Sexual Activity   Alcohol use: Never   Drug use: Never   Sexual activity: Not Currently  Other Topics Concern   Not on file  Social History Narrative   Not on file   Social Drivers of Health   Financial Resource Strain: Low Risk  (03/26/2023)   Overall Financial Resource Strain (CARDIA)    Difficulty of Paying Living Expenses: Not hard at all  Food Insecurity: No Food Insecurity (03/26/2023)   Hunger Vital Sign    Worried About Running Out of Food in the Last Year: Never true  Ran Out of Food in the Last Year: Never true  Transportation Needs: No Transportation Needs (03/26/2023)   PRAPARE - Administrator, Civil Service (Medical): No    Lack of Transportation (Non-Medical): No  Physical Activity: Insufficiently Active (03/26/2023)   Exercise Vital Sign    Days of Exercise per Week: 3 days    Minutes of Exercise per Session: 20 min  Stress: No Stress Concern Present (03/26/2023)   [employer] of Occupational Health - Occupational Stress Questionnaire    Feeling of Stress : Not at all  Social Connections: Socially Isolated (03/26/2023)   Social Connection and Isolation Panel [NHANES]     Frequency of Communication with Friends and Family: Three times a week    Frequency of Social Gatherings with Friends and Family: Patient declined    Attends Religious Services: Never    Database administrator or Organizations: No    Attends Banker Meetings: Never    Marital Status: Widowed  Intimate Partner Violence: Not At Risk (03/26/2023)   Humiliation, Afraid, Rape, and Kick questionnaire    Fear of Current or Ex-Partner: No    Emotionally Abused: No    Physically Abused: No    Sexually Abused: No    Review of Systems: See HPI, otherwise negative ROS  Physical Exam: BP (!) 143/73   Pulse 63   Temp (!) 97.4 F (36.3 C) (Temporal)   Resp 12   Ht 5' 2.5" (1.588 m)   Wt 67.1 kg   SpO2 93%   BMI 26.64 kg/m  General:   Alert,  pleasant and cooperative in NAD Head:  Normocephalic and atraumatic. Lungs:  Clear to auscultation.    Heart:  Regular rate and rhythm.   Impression/Plan: Kirsten Perez is here for ophthalmic surgery.  Risks, benefits, limitations, and alternatives regarding ophthalmic surgery have been reviewed with the patient.  Questions have been answered.  All parties agreeable.   Annell Kidney, MD  05/20/2023, 8:28 AM

## 2023-05-20 NOTE — Op Note (Signed)
 OPERATIVE NOTE  Kirsten Perez [id number] 05/20/2023   PREOPERATIVE DIAGNOSIS:  Nuclear sclerotic cataract left eye. H25.12   POSTOPERATIVE DIAGNOSIS:    Nuclear sclerotic cataract left eye.     PROCEDURE:  Phacoemusification with posterior chamber intraocular lens placement of the left eye  Ultrasound time: Procedure(s): PHACOEMULSIFICATION, CATARACT, WITH IOL INSERTION 5.32 00:34.5 (Left)  LENS:   Implant Name Type Inv. Item Serial No. Manufacturer Lot No. LRB No. Used Action  LENS IOL TECNIS EYHANCE 9.5 - [id number] Intraocular Lens LENS IOL TECNIS EYHANCE 9.5 [id number] [employer]  Left 1 Implanted      SURGEON:  Berline Brenner, MD   ANESTHESIA:  Topical with tetracaine  drops and 2% Xylocaine  jelly, augmented with 1% preservative-free intracameral lidocaine .    COMPLICATIONS:  None.   DESCRIPTION OF PROCEDURE:  The patient was identified in the holding room and transported to the operating room and placed in the supine position under the operating microscope.  The left eye was identified as the operative eye and it was prepped and draped in the usual sterile ophthalmic fashion.   A 1 millimeter clear-corneal paracentesis was made at the 1:30 position.  0.5 ml of preservative-free 1% lidocaine  was injected into the anterior chamber.  The anterior chamber was filled with Viscoat viscoelastic.  A 2.4 millimeter keratome was used to make a near-clear corneal incision at the 10:30 position.  .  A curvilinear capsulorrhexis was made with a cystotome and capsulorrhexis forceps.  Balanced salt  solution was used to hydrodissect and hydrodelineate the nucleus.   Phacoemulsification was then used in stop and chop fashion to remove the lens nucleus and epinucleus.  The remaining cortex was then removed using the irrigation and aspiration handpiece. Provisc was then placed into the capsular bag to distend it for lens placement.  A lens was then injected into the capsular bag.  The  remaining viscoelastic was aspirated.   Wounds were hydrated with balanced salt  solution.  The anterior chamber was inflated to a physiologic pressure with balanced salt  solution.  No wound leaks were noted. Cefuroxime  0.1 ml of a 10mg /ml solution was injected into the anterior chamber for a dose of 1 mg of intracameral antibiotic at the completion of the case.   Timolol  and Brimonidine  drops were applied to the eye.  The patient was taken to the recovery room in stable condition without complications of anesthesia or surgery.  Winslow Ederer 05/20/2023, 9:28 AM

## 2023-05-20 NOTE — Transfer of Care (Signed)
 Immediate Anesthesia Transfer of Care Note  Patient: Kirsten Perez  Procedure(s) Performed: PHACOEMULSIFICATION, CATARACT, WITH IOL INSERTION 5.32 00:34.5 (Left: Eye)  Patient Location: PACU  Anesthesia Type: MAC  Level of Consciousness: awake, alert  and patient cooperative  Airway and Oxygen Therapy: Patient Spontanous Breathing and Patient connected to supplemental oxygen  Post-op Assessment: Post-op Vital signs reviewed, Patient's Cardiovascular Status Stable, Respiratory Function Stable, Patent Airway and No signs of Nausea or vomiting  Post-op Vital Signs: Reviewed and stable  Complications: No notable events documented.

## 2023-05-21 ENCOUNTER — Encounter: Payer: Self-pay | Admitting: Ophthalmology

## 2023-05-21 DIAGNOSIS — H2512 Age-related nuclear cataract, left eye: Secondary | ICD-10-CM | POA: Diagnosis not present

## 2023-06-09 DIAGNOSIS — H35371 Puckering of macula, right eye: Secondary | ICD-10-CM | POA: Diagnosis not present

## 2023-06-24 ENCOUNTER — Telehealth: Payer: Self-pay | Admitting: Internal Medicine

## 2023-06-24 NOTE — Telephone Encounter (Signed)
 Patient has enough until her f/u with Dr. Ava Lei. Will refill during that appt.

## 2023-06-24 NOTE — Telephone Encounter (Signed)
metFORMIN (GLUCOPHAGE) 500 MG tablet 

## 2023-06-26 ENCOUNTER — Other Ambulatory Visit: Payer: Self-pay | Admitting: Internal Medicine

## 2023-06-26 DIAGNOSIS — E1165 Type 2 diabetes mellitus with hyperglycemia: Secondary | ICD-10-CM

## 2023-06-27 NOTE — Telephone Encounter (Signed)
 Requested Prescriptions  Pending Prescriptions Disp Refills   metFORMIN  (GLUCOPHAGE ) 500 MG tablet [Pharmacy Med Name: METFORMIN  500 MG TAB[*]] 90 tablet 0    Sig: TAKE ONE TABLET BY MOUTH ONE TIME DAILY WITH BREAKFAST     Endocrinology:  Diabetes - Biguanides Failed - 06/27/2023  5:47 PM      Failed - B12 Level in normal range and within 720 days    No results found for: "VITAMINB12"       Passed - Cr in normal range and within 360 days    Creat  Date Value Ref Range Status  07/03/2022 0.73 0.60 - 0.95 mg/dL Final   Creatinine, Urine  Date Value Ref Range Status  11/17/2022 135 20 - 275 mg/dL Final         Passed - HBA1C is between 0 and 7.9 and within 180 days    Hgb A1c MFr Bld  Date Value Ref Range Status  01/08/2023 6.7 (H) <5.7 % of total Hgb Final    Comment:    For someone without known diabetes, a hemoglobin A1c value of 6.5% or greater indicates that they may have  diabetes and this should be confirmed with a follow-up  test. . For someone with known diabetes, a value <7% indicates  that their diabetes is well controlled and a value  greater than or equal to 7% indicates suboptimal  control. A1c targets should be individualized based on  duration of diabetes, age, comorbid conditions, and  other considerations. . Currently, no consensus exists regarding use of hemoglobin A1c for diagnosis of diabetes for children. .          Passed - eGFR in normal range and within 360 days    eGFR  Date Value Ref Range Status  07/03/2022 82 > OR = 60 mL/min/1.83m2 Final         Passed - Valid encounter within last 6 months    Recent Outpatient Visits   None            Passed - CBC within normal limits and completed in the last 12 months    WBC  Date Value Ref Range Status  07/03/2022 6.8 3.8 - 10.8 Thousand/uL Final   RBC  Date Value Ref Range Status  07/03/2022 5.08 3.80 - 5.10 Million/uL Final   Hemoglobin  Date Value Ref Range Status  07/03/2022 14.6  11.7 - 15.5 g/dL Final   HCT  Date Value Ref Range Status  07/03/2022 44.8 35.0 - 45.0 % Final   MCHC  Date Value Ref Range Status  07/03/2022 32.6 32.0 - 36.0 g/dL Final   [facility]  Date Value Ref Range Status  07/03/2022 28.7 27.0 - 33.0 pg Final   MCV  Date Value Ref Range Status  07/03/2022 88.2 80.0 - 100.0 fL Final   No results found for: "PLTCOUNTKUC", "LABPLAT", "POCPLA" RDW  Date Value Ref Range Status  07/03/2022 14.2 11.0 - 15.0 % Final

## 2023-07-07 ENCOUNTER — Ambulatory Visit: Admitting: Family Medicine

## 2023-07-07 ENCOUNTER — Encounter: Payer: Self-pay | Admitting: Family Medicine

## 2023-07-07 VITALS — BP 126/80 | HR 85 | Resp 16 | Ht 62.5 in | Wt 143.6 lb

## 2023-07-07 DIAGNOSIS — J849 Interstitial pulmonary disease, unspecified: Secondary | ICD-10-CM | POA: Diagnosis not present

## 2023-07-07 DIAGNOSIS — I700 Atherosclerosis of aorta: Secondary | ICD-10-CM

## 2023-07-07 DIAGNOSIS — Z79899 Other long term (current) drug therapy: Secondary | ICD-10-CM | POA: Diagnosis not present

## 2023-07-07 DIAGNOSIS — Z8711 Personal history of peptic ulcer disease: Secondary | ICD-10-CM | POA: Diagnosis not present

## 2023-07-07 DIAGNOSIS — E785 Hyperlipidemia, unspecified: Secondary | ICD-10-CM | POA: Diagnosis not present

## 2023-07-07 DIAGNOSIS — Z7984 Long term (current) use of oral hypoglycemic drugs: Secondary | ICD-10-CM | POA: Diagnosis not present

## 2023-07-07 DIAGNOSIS — R053 Chronic cough: Secondary | ICD-10-CM

## 2023-07-07 DIAGNOSIS — E1169 Type 2 diabetes mellitus with other specified complication: Secondary | ICD-10-CM

## 2023-07-07 LAB — POCT GLYCOSYLATED HEMOGLOBIN (HGB A1C): Hemoglobin A1C: 6.1 % — AB (ref 4.0–5.6)

## 2023-07-07 MED ORDER — SUCRALFATE 1 G PO TABS: ORAL_TABLET | ORAL | 0 refills | Status: DC

## 2023-07-07 MED ORDER — EZETIMIBE 10 MG PO TABS: 10.0000 mg | ORAL_TABLET | Freq: Every day | ORAL | 1 refills | Status: DC

## 2023-07-07 NOTE — Progress Notes (Signed)
 Name: Kirsten Perez   MRN: [id number]    DOB: 09-Dec-1940   Date:07/07/2023       Progress Note  Subjective  Chief Complaint  No chief complaint on file.  Discussed the use of AI scribe software for clinical note transcription with the patient, who gave verbal consent to proceed.  History of Present Illness Kirsten Perez is an 83 year old female with interstitial lung disease who presents with chronic cough and wheezing.  She has been experiencing a chronic cough and wheezing, which she associates with her cholesterol medication. Previously, she used Repatha , which was discontinued due to these symptoms, and she resolved a few weeks after stopping the medication. However, after starting Nexlizet  , her cough worsened. The cough originates from the back of her throat and is sometimes accompanied by wheezing, which can be embarrassing and unpredictable. She has tried various treatments, including Flovent  and Symbicort , without relief.  She has a history of evaluation by a pulmonologist and a gastroenterologist for recurrent cough , she had multiple studies done, CT showed interstitial lung disease She has not seen a pulmonologist recently and has only had one visit with the previous pulmonologist.  Her type 2 diabetes is managed with metformin , which she takes once daily. Her blood sugar levels are well-controlled, with readings between 90 and 130 mg/dL, and her W0J is 8.1%.  She has high cholesterol but cannot take statins due to concerns about liver effects. She previously used Repatha  but discontinued it due to side effects. Her cholesterol has been high since her 30s, with levels around 300 mg/dL, and she has a history of a heart attack and stroke. She also has atherosclerosis of the aorta.  She has a history of gastric ulcers and is currently taking pantoprazole  twice daily and sucralfate  as needed. She reports occasional use of Carafate .    Patient Active Problem List   Diagnosis Date Noted    Type 2 diabetes mellitus with hyperglycemia, without long-term current use of insulin (HCC) 01/08/2023   Upper airway cough syndrome 09/06/2021   HLD (hyperlipidemia) 03/04/2021   Statin intolerance 03/04/2021   History of gastric ulcer 03/04/2021    Past Surgical History:  Procedure Laterality Date   CATARACT EXTRACTION W/PHACO Right 05/06/2023   Procedure: PHACOEMULSIFICATION, CATARACT, WITH IOL INSERTION 7.31 00:35.3;  Surgeon: Annell Kidney, MD;  Location: [facility];  Service: Ophthalmology;  Laterality: Right;   CATARACT EXTRACTION W/PHACO Left 05/20/2023   Procedure: PHACOEMULSIFICATION, CATARACT, WITH IOL INSERTION 5.32 00:34.5;  Surgeon: Annell Kidney, MD;  Location: [facility];  Service: Ophthalmology;  Laterality: Left;   CHOLECYSTECTOMY     ROTATOR CUFF REPAIR Right     Family History  Problem Relation Age of Onset   Hypertension Mother    Stroke Mother    Heart disease Father     Social History   Tobacco Use   Smoking status: Former    Current packs/day: 0.00    Types: Cigarettes    Quit date: 02/27/2014    Years since quitting: 9.3   Smokeless tobacco: Never   Tobacco comments:    Smoked a pack or less a week.  Substance Use Topics   Alcohol use: Never     Current Outpatient Medications:    Bempedoic Acid-Ezetimibe (NEXLIZET ) 180-10 MG TABS, Take 1 tablet by mouth daily., Disp: 90 tablet, Rfl: 1   Continuous Glucose Sensor (FREESTYLE LIBRE 3 PLUS SENSOR) MISC, Change sensor every 15 days., Disp: 6 each, Rfl: 1   famotidine  (  PEPCID ) 20 MG tablet, TAKE ONE TABLET BY MOUTH ONE TIME DAILY, Disp: 90 tablet, Rfl: 0   metFORMIN  (GLUCOPHAGE ) 500 MG tablet, TAKE ONE TABLET BY MOUTH ONE TIME DAILY WITH BREAKFAST, Disp: 90 tablet, Rfl: 0   Multiple Vitamin (MULTIVITAMIN) capsule, Take 1 capsule by mouth daily., Disp: , Rfl:    pantoprazole  (PROTONIX ) 40 MG tablet, Take 1 tablet (40 mg total) by mouth daily., Disp: 90 tablet, Rfl: 3    sucralfate  (CARAFATE ) 1 g tablet, TAKE 1 TABLET 4 TIMES DAILY WITH MEALS AND AT BEDTIME., Disp: 360 tablet, Rfl: 0   Evolocumab  (REPATHA  SURECLICK) 140 MG/ML SOAJ, INJECT 140 MG UNDER THE SKIN EVERY TWO WEEKS INTO THE THIGH, STOMACH, OR UPPER ARM (Patient not taking: Reported on 04/22/2023), Disp: 2 mL, Rfl: 2  Allergies  Allergen Reactions   Statins     Myalgias    Sulfa Antibiotics     I personally reviewed active problem list, medication list, allergies, family history with the patient/caregiver today.   ROS  Ten systems reviewed and is negative except as mentioned in HPI    Objective Physical Exam CONSTITUTIONAL: Patient appears well-developed and well-nourished. No distress. HEENT: Head atraumatic, normocephalic, neck supple. CARDIOVASCULAR: Normal rate, regular rhythm and normal heart sounds. No murmur heard. No BLE edema. PULMONARY: Effort normal. Wheezing present on the left side. No respiratory distress. ABDOMINAL: There is no tenderness or distention. MUSCULOSKELETAL: Normal gait. Without gross motor or sensory deficit. PSYCHIATRIC: Patient has a normal mood and affect. Behavior is normal. Judgment and thought content normal.  Vitals:   07/07/23 1102  BP: 126/80  Pulse: 85  Resp: 16  SpO2: 96%  Weight: 143 lb 9.6 oz (65.1 kg)  Height: 5' 2.5" (1.588 m)    Body mass index is 25.85 kg/m.  Recent Results (from the past 2160 hours)  HM DIABETES EYE EXAM     Status: None   Collection Time: 04/09/23  8:29 AM  Result Value Ref Range   HM Diabetic Eye Exam No Retinopathy No Retinopathy    Comment: ABS BY HIM  Glucose, capillary     Status: Abnormal   Collection Time: 05/06/23  9:31 AM  Result Value Ref Range   Glucose-Capillary 115 (H) 70 - 99 mg/dL    Comment: Glucose reference range applies only to samples taken after fasting for at least 8 hours.   Comment 1 Notify RN   Glucose, capillary     Status: None   Collection Time: 05/20/23  8:22 AM  Result Value Ref  Range   Glucose-Capillary 96 70 - 99 mg/dL    Comment: Glucose reference range applies only to samples taken after fasting for at least 8 hours.   Comment 1 Notify RN   POCT glycosylated hemoglobin (Hb A1C)     Status: Abnormal   Collection Time: 07/07/23 11:07 AM  Result Value Ref Range   Hemoglobin A1C 6.1 (A) 4.0 - 5.6 %   HbA1c POC (<> result, manual entry)     HbA1c, POC (prediabetic range)     HbA1c, POC (controlled diabetic range)      Diabetic Foot Exam:     PHQ2/9:    07/07/2023   10:59 AM 03/26/2023    2:50 PM 01/08/2023    1:38 PM 11/17/2022   10:22 AM 10/09/2022    3:25 PM  Depression screen PHQ 2/9  Decreased Interest 0 0 0 0 0  Down, Depressed, Hopeless 0 0 0 0 0  PHQ - 2 Score  0 0 0 0 0  Altered sleeping  0  0 0  Tired, decreased energy  0  0 0  Change in appetite  0  0 0  Feeling bad or failure about yourself   0  0 0  Trouble concentrating  0  0 0  Moving slowly or fidgety/restless  0  0 0  Suicidal thoughts  0  0 0  PHQ-9 Score  0  0 0  Difficult doing work/chores  Not difficult at all  Not difficult at all Not difficult at all    phq 9 is negative  Fall Risk:    07/07/2023   10:50 AM 03/26/2023    2:53 PM 03/24/2023   10:55 AM 01/08/2023    1:38 PM 11/17/2022   10:17 AM  Fall Risk   Falls in the past year? 0 0 0 0 0  Number falls in past yr: 0 0 0 0 0  Injury with Fall? 0 0 0 0 0  Risk for fall due to : No Fall Risks No Fall Risks     Follow up Falls prevention discussed;Education provided;Falls evaluation completed Falls prevention discussed;Falls evaluation completed  Falls evaluation completed       Assessment & Plan Chronic Cough Chronic cough with wheezing lthat she thought it was secondary to Repatha  but since off Repatha  and on Nexlizet  symptoms still present. Unlikely due to drugs. More likely due to interstitial lung disease involvement. Wheezing atypical for asthma. - Consider pulmonology referral if symptoms persist. - Avoid  medications exacerbating cough. - Discussed Tessalon  Perles for symptomatic relief.  Interstitial Lung Disease Possible contributor to chronic cough and wheezing. Potential autoimmune etiology. - Discuss pulmonology evaluation if desired.  Type 2 Diabetes Mellitus with dyslipidemia Diabetes well-controlled with metformin . A1c is 6.1. No significant side effects. - Continue metformin  as prescribed. - Regularly monitor blood glucose levels.  Hyperlipidemia Intolerance to statins due to liver concerns. Repatha  and Natalizumab exacerbated cough. Considering Zetia for cholesterol management. Previous LDL was 222 mg/dL. History of aortic atherosclerosis. - Initiate Zetia (ezetimibe) and re-evaluate cholesterol levels in 6 months.  Gastroesophageal Reflux Disease (GERD) GERD managed with pantoprazole  and sucralfate . Occasional clear phlegm production. - Refill sucralfate  prescription. - Continue pantoprazole  as prescribed.  General Health Maintenance Due for routine blood tests not performed in over a year. - Order blood tests including kidney and liver function tests, A1c, and CBC.  Follow-up Follows up with primary care every six months. Recent insurance network issue resolved. - Schedule follow-up appointment with primary care in 6 months.

## 2023-07-08 LAB — CBC WITH DIFFERENTIAL/PLATELET
Absolute Lymphocytes: 2792 {cells}/uL (ref 850–3900)
Absolute Monocytes: 832 {cells}/uL (ref 200–950)
Basophils Absolute: 79 {cells}/uL (ref 0–200)
Basophils Relative: 0.8 %
Eosinophils Absolute: 1069 {cells}/uL — ABNORMAL HIGH (ref 15–500)
Eosinophils Relative: 10.8 %
HCT: 47.5 % — ABNORMAL HIGH (ref 35.0–45.0)
Hemoglobin: 15.5 g/dL (ref 11.7–15.5)
MCH: 29.1 pg (ref 27.0–33.0)
MCHC: 32.6 g/dL (ref 32.0–36.0)
MCV: 89.1 fL (ref 80.0–100.0)
MPV: 11 fL (ref 7.5–12.5)
Monocytes Relative: 8.4 %
Neutro Abs: 5128 {cells}/uL (ref 1500–7800)
Neutrophils Relative %: 51.8 %
Platelets: 317 10*3/uL (ref 140–400)
RBC: 5.33 10*6/uL — ABNORMAL HIGH (ref 3.80–5.10)
RDW: 13.5 % (ref 11.0–15.0)
Total Lymphocyte: 28.2 %
WBC: 9.9 10*3/uL (ref 3.8–10.8)

## 2023-07-08 LAB — COMPREHENSIVE METABOLIC PANEL WITH GFR
AG Ratio: 1.5 (calc) (ref 1.0–2.5)
ALT: 10 U/L (ref 6–29)
AST: 22 U/L (ref 10–35)
Albumin: 4.6 g/dL (ref 3.6–5.1)
Alkaline phosphatase (APISO): 52 U/L (ref 37–153)
BUN: 22 mg/dL (ref 7–25)
CO2: 30 mmol/L (ref 20–32)
Calcium: 10.1 mg/dL (ref 8.6–10.4)
Chloride: 101 mmol/L (ref 98–110)
Creat: 0.84 mg/dL (ref 0.60–0.95)
Globulin: 3 g/dL (ref 1.9–3.7)
Glucose, Bld: 115 mg/dL — ABNORMAL HIGH (ref 65–99)
Potassium: 5 mmol/L (ref 3.5–5.3)
Sodium: 140 mmol/L (ref 135–146)
Total Bilirubin: 0.7 mg/dL (ref 0.2–1.2)
Total Protein: 7.6 g/dL (ref 6.1–8.1)
eGFR: 69 mL/min/{1.73_m2} (ref >=60)

## 2023-07-08 LAB — MICROALBUMIN / CREATININE URINE RATIO
Creatinine, Urine: 111 mg/dL (ref 20–275)
Microalb Creat Ratio: 5 mg/g{creat} (ref <30)
Microalb, Ur: 0.5 mg/dL

## 2023-07-10 ENCOUNTER — Ambulatory Visit: Payer: Self-pay | Admitting: Family Medicine

## 2023-08-13 ENCOUNTER — Other Ambulatory Visit: Payer: Self-pay | Admitting: Internal Medicine

## 2023-08-13 DIAGNOSIS — Z8711 Personal history of peptic ulcer disease: Secondary | ICD-10-CM

## 2023-08-14 NOTE — Telephone Encounter (Signed)
 Requested Prescriptions  Pending Prescriptions Disp Refills   famotidine  (PEPCID ) 20 MG tablet [Pharmacy Med Name: FAMOTIDINE  20 MG TAB[*]] 90 tablet 0    Sig: TAKE ONE TABLET BY MOUTH ONE TIME DAILY     Gastroenterology:  H2 Antagonists Passed - 08/14/2023 12:05 PM      Passed - Valid encounter within last 12 months    Recent Outpatient Visits           1 month ago Dyslipidemia associated with type 2 diabetes mellitus [facility])   [facility] Sowles, Krichna, MD

## 2023-08-25 ENCOUNTER — Ambulatory Visit (INDEPENDENT_AMBULATORY_CARE_PROVIDER_SITE_OTHER): Admitting: Internal Medicine

## 2023-08-25 ENCOUNTER — Encounter: Payer: Self-pay | Admitting: Internal Medicine

## 2023-08-25 VITALS — BP 120/76 | HR 82 | Temp 98.1°F | Resp 16 | Ht 62.5 in | Wt 142.3 lb

## 2023-08-25 DIAGNOSIS — R053 Chronic cough: Secondary | ICD-10-CM

## 2023-08-25 DIAGNOSIS — R062 Wheezing: Secondary | ICD-10-CM | POA: Diagnosis not present

## 2023-08-25 DIAGNOSIS — R0602 Shortness of breath: Secondary | ICD-10-CM

## 2023-08-25 MED ORDER — ALBUTEROL SULFATE HFA 108 (90 BASE) MCG/ACT IN AERS: 2.0000 | INHALATION_SPRAY | Freq: Four times a day (QID) | RESPIRATORY_TRACT | 2 refills | Status: DC | PRN

## 2023-08-25 MED ORDER — METHYLPREDNISOLONE 4 MG PO TBPK: ORAL_TABLET | ORAL | 0 refills | Status: DC

## 2023-08-25 NOTE — Progress Notes (Signed)
 Established Patient Office Visit  Subjective   Patient ID: Kirsten Perez, female    DOB: 04/05/1940  Age: 83 y.o. MRN: [id number]  Chief Complaint  Patient presents with   Medical Management of Chronic Issues    6 month recheck   Cough    HPI  Patient here for follow up on chronic medical conditions and reoccurrence of cough and wheezing.   Discussed the use of AI scribe software for clinical note transcription with the patient, who gave verbal consent to proceed.  History of Present Illness Kirsten Perez is an 83 year old female with chronic cough and wheezing who presents for evaluation of persistent respiratory symptoms.  She has a persistent cough that feels like something is caught at the top of her throat. A swallow test and previous lung x-rays were normal. A CT scan in November 2023 showed air trapping, but not interstitial lung disease. She experiences wheezing, particularly on exhalation, which worsens with the cough. Inhalers like Breztri  provide some relief, but she finds them generally ineffective. She occasionally takes a sinus pill with an antihistamine for severe symptoms.  She has high cholesterol and was previously on Repatha , which was discontinued due to concerns about causing her cough. She recently started Zetia . She has type 2 diabetes and is on metformin , but experiences frequent low blood sugar episodes, including nocturnal hypoglycemia. Her A1c was 6.1 in June.  Her cough and wheezing are embarrassing and unpredictable, affecting social interactions. She feels chest pressure and difficulty breathing when turning over in bed, but does not experience shortness of breath during physical activities like walking on a treadmill. Her current medications include Zetia , Nexium, Pepcid , and pantoprazole . She has used inhalers like Breztri  and albuterol  with mixed results.   HLD: -Medications: had been on Repatha  every 2 weeks - patient held medication for 2 months due  to potential respiratory side effects. Now only on Zetia , started 1 month ago. -Hx of statin intolerance - caused myalgias  -Last lipid panel: Lipid Panel   Lipid Panel     Component Value Date/Time   CHOL 315 (H) 01/08/2023 1422   TRIG 129 01/08/2023 1422   HDL 66 01/08/2023 1422   CHOLHDL 4.8 01/08/2023 1422   LDLCALC 222 (H) 01/08/2023 1422    Gastric Ulcer: -Currently on Protonix  40 mg in the morning, Pepcid  20 mg, Carafate  1 g daily  -Was hospitalized after having dark red blood in stool in May, 2022 had an EGD and found to have a bleeding ulcer in [place]  -Was evaluated by GI, note reviewed on 10/17/2021.  Diabetes, Type 2: -Last A1c 6/25 6.1% -Medications: Metformin  500 mg  -Checking BG at home: yes has [employer] 2 but noticed some low blood sugars lately, anywhere from 60-70 making her CMG alert -Eye exam: UTD -Foot exam: UTD -Microalbumin: UTD 10/24 -Statin: No, see above -PNA vaccine: UTD -Denies polyuria, polydipsia, numbness extremities, foot ulcers/trauma.   Health Maintenance: -Blood work UTD -Mammogram - does not continue with screening -Colon cancer screening - does not continue with screening  Patient Active Problem List   Diagnosis Date Noted   Type 2 diabetes mellitus with hyperglycemia, without long-term current use of insulin (HCC) 01/08/2023   Upper airway cough syndrome 09/06/2021   HLD (hyperlipidemia) 03/04/2021   Statin intolerance 03/04/2021   History of gastric ulcer 03/04/2021   Past Medical History:  Diagnosis Date   Aortic atherosclerosis (HCC)    Arthritis    Atopic dermatitis    Diabetes mellitus  without complication (HCC)    Gastric ulcer    Hyperlipidemia    Wears contact lenses    Past Surgical History:  Procedure Laterality Date   CATARACT EXTRACTION W/PHACO Right 05/06/2023   Procedure: PHACOEMULSIFICATION, CATARACT, WITH IOL INSERTION 7.31 00:35.3;  Surgeon: Mittie Gaskin, MD;  Location: [facility];  Service: Ophthalmology;  Laterality: Right;   CATARACT EXTRACTION W/PHACO Left 05/20/2023   Procedure: PHACOEMULSIFICATION, CATARACT, WITH IOL INSERTION 5.32 00:34.5;  Surgeon: Mittie Gaskin, MD;  Location: [facility];  Service: Ophthalmology;  Laterality: Left;   CHOLECYSTECTOMY     ROTATOR CUFF REPAIR Right    Social History   Tobacco Use   Smoking status: Former    Current packs/day: 0.00    Types: Cigarettes    Quit date: 02/27/2014    Years since quitting: 9.4   Smokeless tobacco: Never   Tobacco comments:    Smoked a pack or less a week.  Vaping Use   Vaping status: Never Used  Substance Use Topics   Alcohol use: Never   Drug use: Never   Social History   Socioeconomic History   Marital status: Widowed    Spouse name: Not on file   Number of children: Not on file   Years of education: Not on file   Highest education level: 12th grade  Occupational History   Not on file  Tobacco Use   Smoking status: Former    Current packs/day: 0.00    Types: Cigarettes    Quit date: 02/27/2014    Years since quitting: 9.4   Smokeless tobacco: Never   Tobacco comments:    Smoked a pack or less a week.  Vaping Use   Vaping status: Never Used  Substance and Sexual Activity   Alcohol use: Never   Drug use: Never   Sexual activity: Not Currently  Other Topics Concern   Not on file  Social History Narrative   Not on file   Social Drivers of Health   Financial Resource Strain: Patient Declined (08/24/2023)   Overall Financial Resource Strain (CARDIA)    Difficulty of Paying Living Expenses: Patient declined  Food Insecurity: No Food Insecurity (08/24/2023)   Hunger Vital Sign    Worried About Running Out of Food in the Last Year: Never true    Ran Out of Food in the Last Year: Never true  Transportation Needs: No Transportation Needs (08/24/2023)   PRAPARE - Administrator, Civil Service (Medical): No    Lack of Transportation (Non-Medical):  No  Physical Activity: Insufficiently Active (08/24/2023)   Exercise Vital Sign    Days of Exercise per Week: 3 days    Minutes of Exercise per Session: 20 min  Stress: Patient Declined (08/24/2023)   [employer] of Occupational Health - Occupational Stress Questionnaire    Feeling of Stress: Patient declined  Social Connections: Unknown (08/24/2023)   Social Connection and Isolation Panel    Frequency of Communication with Friends and Family: Patient declined    Frequency of Social Gatherings with Friends and Family: Patient declined    Attends Religious Services: Never    Database administrator or Organizations: No    Attends Engineer, structural: Not on file    Marital Status: Widowed  Intimate Partner Violence: Not At Risk (03/26/2023)   Humiliation, Afraid, Rape, and Kick questionnaire    Fear of Current or Ex-Partner: No    Emotionally Abused: No    Physically Abused: No  Sexually Abused: No   Family Status  Relation Name Status   Mother  Deceased   Father  Deceased   Sister 1 Alive   Son 2 Alive  No partnership data on file   Family History  Problem Relation Age of Onset   Hypertension Mother    Stroke Mother    Heart disease Father    Allergies  Allergen Reactions   Statins     Myalgias    Sulfa Antibiotics       Review of Systems  Constitutional:  Negative for chills and fever.  Respiratory:  Positive for cough, shortness of breath and wheezing. Negative for sputum production.   Cardiovascular:  Negative for chest pain.      Objective:     BP 120/76 (Cuff Size: Large)   Pulse 82   Temp 98.1 F (36.7 C) (Oral)   Resp 16   Ht 5' 2.5 (1.588 m)   Wt 142 lb 4.8 oz (64.5 kg)   SpO2 94%   BMI 25.61 kg/m  BP Readings from Last 3 Encounters:  08/25/23 120/76  07/07/23 126/80  05/20/23 126/74   Wt Readings from Last 3 Encounters:  08/25/23 142 lb 4.8 oz (64.5 kg)  07/07/23 143 lb 9.6 oz (65.1 kg)  05/20/23 148 lb (67.1 kg)       Physical Exam Constitutional:      Appearance: Normal appearance.  HENT:     Head: Normocephalic and atraumatic.  Eyes:     Conjunctiva/sclera: Conjunctivae normal.  Cardiovascular:     Rate and Rhythm: Normal rate and regular rhythm.  Pulmonary:     Effort: Pulmonary effort is normal.     Breath sounds: Wheezing present. No rhonchi or rales.     Comments: Inspiratory and expiratory wheezing throughout Skin:    General: Skin is warm and dry.  Neurological:     General: No focal deficit present.     Mental Status: She is alert. Mental status is at baseline.  Psychiatric:        Mood and Affect: Mood normal.        Behavior: Behavior normal.      No results found for any visits on 08/25/23.  Last CBC Lab Results  Component Value Date   WBC 9.9 07/07/2023   HGB 15.5 07/07/2023   HCT 47.5 (H) 07/07/2023   MCV 89.1 07/07/2023   MCH 29.1 07/07/2023   RDW 13.5 07/07/2023   PLT 317 07/07/2023   Last metabolic panel Lab Results  Component Value Date   GLUCOSE 115 (H) 07/07/2023   NA 140 07/07/2023   K 5.0 07/07/2023   CL 101 07/07/2023   CO2 30 07/07/2023   BUN 22 07/07/2023   CREATININE 0.84 07/07/2023   EGFR 69 07/07/2023   CALCIUM 10.1 07/07/2023   PROT 7.6 07/07/2023   BILITOT 0.7 07/07/2023   AST 22 07/07/2023   ALT 10 07/07/2023   Last lipids Lab Results  Component Value Date   CHOL 315 (H) 01/08/2023   HDL 66 01/08/2023   LDLCALC 222 (H) 01/08/2023   TRIG 129 01/08/2023   CHOLHDL 4.8 01/08/2023   Last hemoglobin A1c Lab Results  Component Value Date   HGBA1C 6.1 (A) 07/07/2023   Last thyroid  functions No results found for: TSH, T3TOTAL, T4TOTAL, THYROIDAB Last vitamin D No results found for: 25OHVITD2, 25OHVITD3, VD25OH Last vitamin B12 and Folate No results found for: VITAMINB12, FOLATE    The ASCVD Risk score (Arnett DK, et al.,  2019) failed to calculate for the following reasons:   The 2019 ASCVD risk score is only  valid for ages 23 to 40    Assessment & Plan:   Assessment & Plan Chronic cough with wheezing Persistent cough with wheezing, differential includes COPD, asthma, and cardiac causes. Previous CT showed air trapping. Cardiac involvement considered due to chest pressure and breathing difficulty. - Refer to pulmonologist  - Start Claritin or Allegra for allergies. - Provide Trelegy samples for wheezing relief. - Prescribe albuterol  inhaler as needed. - Order echocardiogram for cardiac evaluation. - Prescribe Medrol  Dosepak to assess steroid response.  Allergic rhinitis (suspected) Suspected allergic rhinitis with high eosinophil count, despite negative allergy tests. - Start Claritin or Allegra for allergy management.  Type 2 diabetes mellitus, diet controlled Well-controlled diabetes with diet, recent A1c 6.1. Frequent hypoglycemia suggests metformin  overtreatment. - Discontinue metformin . - Monitor blood glucose, resume metformin  if hyperglycemia occurs.  Hyperlipidemia Managed with Zetia  after switching from Repatha  due to cost and cough association. Too early to assess cholesterol impact, plan to recheck in about 6 months, - Continue Zetia . - Recheck cholesterol levels in December or January.   - Ambulatory referral to Pulmonology - albuterol  (VENTOLIN  HFA) 108 (90 Base) MCG/ACT inhaler; Inhale 2 puffs into the lungs every 6 (six) hours as needed for wheezing or shortness of breath.  Dispense: 8 g; Refill: 2 - methylPREDNISolone  (MEDROL  DOSEPAK) 4 MG TBPK tablet; Use as directed.  Dispense: 21 each; Refill: 0 - ECHOCARDIOGRAM COMPLETE; Future   Return in about 3 months (around 11/25/2023).    Sharyle Fischer, DO

## 2023-09-04 ENCOUNTER — Other Ambulatory Visit: Payer: Self-pay | Admitting: Internal Medicine

## 2023-09-04 DIAGNOSIS — E1165 Type 2 diabetes mellitus with hyperglycemia: Secondary | ICD-10-CM

## 2023-09-08 NOTE — Telephone Encounter (Signed)
 Requested medication (s) are due for refill today: yes  Requested medication (s) are on the active medication list: yes  Last refill:  05/01/23 6 each 1 RF  Future visit scheduled: no  Notes to clinic:  prescription not assigned to a  protocol   Requested Prescriptions  Pending Prescriptions Disp Refills   Continuous Glucose Sensor (FREESTYLE LIBRE 3 PLUS SENSOR) MISC [Pharmacy Med Name: FREESTYLE LIBRE 3 PLUS KIT SENSOR[$]] 6 each 1    Sig: PLACE ONE SENSOR TO THE BACK OF YOUR UPPER ARM. REPLACE EVERY 15 DAYS.     There is no refill protocol information for this order

## 2023-09-14 ENCOUNTER — Telehealth: Payer: Self-pay | Admitting: Internal Medicine

## 2023-09-14 ENCOUNTER — Other Ambulatory Visit: Payer: Self-pay | Admitting: Internal Medicine

## 2023-09-14 MED ORDER — BETAMETHASONE DIPROPIONATE 0.05 % EX CREA: TOPICAL_CREAM | Freq: Two times a day (BID) | CUTANEOUS | 0 refills | Status: DC

## 2023-09-14 NOTE — Telephone Encounter (Signed)
 Will you refill this upon return

## 2023-09-14 NOTE — Telephone Encounter (Signed)
 Patient requesting medication not on active med list: Betamethasone  Dipropionate 0.05% - 45 GM to [facility] [address] [place] - [place], [place] - [address] [facility] [address] [place] [address] Phone: [phone] Fax: [phone]

## 2023-09-14 NOTE — Telephone Encounter (Signed)
 Copied from CRM #[id number]. Topic: Clinical - Medication Refill >> Sep 14, 2023  9:04 AM Montie POUR wrote: Medication: Betamethasone  Dipropionate 0.05% - 45 GM  Has the patient contacted their pharmacy? Yes (Agent: If no, request that the patient contact the pharmacy for the refill. If patient does not wish to contact the pharmacy document the reason why and proceed with request.) (Agent: If yes, when and what did the pharmacy advise?) Pharmacy needs order to refill  This is the patient's preferred pharmacy:  [facility] [address] [place] - [place], [place] - [address] [address] [facility] [address] [place] [address] Phone: [phone] Fax: [phone]  Is this the correct pharmacy for this prescription? Yes If no, delete pharmacy and type the correct one.   Has the prescription been filled recently? No  Is the patient out of the medication? Yes  Has the patient been seen for an appointment in the last year OR does the patient have an upcoming appointment? Yes  Can we respond through MyChart? No  Agent: Please be advised that Rx refills may take up to 3 business days. We ask that you follow-up with your pharmacy.

## 2023-09-29 ENCOUNTER — Other Ambulatory Visit: Payer: Self-pay | Admitting: Internal Medicine

## 2023-09-29 DIAGNOSIS — Z8711 Personal history of peptic ulcer disease: Secondary | ICD-10-CM

## 2023-09-29 NOTE — Telephone Encounter (Signed)
 Copied from CRM #[id number]. Topic: Clinical - Medication Refill >> Sep 29, 2023  1:04 PM Rosaria E wrote: Medication:  sucralfate  (CARAFATE ) 1 g tablet   Has the patient contacted their pharmacy? Yes (Agent: If no, request that the patient contact the pharmacy for the refill. If patient does not wish to contact the pharmacy document the reason why and proceed with request.) (Agent: If yes, when and what did the pharmacy advise?)  This is the patient's preferred pharmacy:  [facility] [address] [place] - [place], [place] - [address] [facility] [address] [facility] [address] [place] [address] Phone: [phone] Fax: [phone]  Is this the correct pharmacy for this prescription? Yes If no, delete pharmacy and type the correct one.   Has the prescription been filled recently? Yes  Is the patient out of the medication? Yes  Has the patient been seen for an appointment in the last year OR does the patient have an upcoming appointment? Yes  Can we respond through MyChart? Yes  Agent: Please be advised that Rx refills may take up to 3 business days. We ask that you follow-up with your pharmacy.

## 2023-09-30 MED ORDER — SUCRALFATE 1 G PO TABS: ORAL_TABLET | ORAL | 0 refills | Status: DC

## 2023-09-30 NOTE — Telephone Encounter (Signed)
 Requested Prescriptions  Pending Prescriptions Disp Refills   sucralfate  (CARAFATE ) 1 g tablet 360 tablet 0    Sig: TAKE 1 TABLET 4 TIMES DAILY WITH MEALS AND AT BEDTIME.     Gastroenterology: Antiacids Passed - 09/30/2023 10:06 AM      Passed - Valid encounter within last 12 months    Recent Outpatient Visits           1 month ago Chronic cough   [facility] Bernardo Fend, DO   2 months ago Dyslipidemia associated with type 2 diabetes mellitus [facility])   [facility] Sowles, Krichna, MD

## 2023-10-15 ENCOUNTER — Ambulatory Visit: Admitting: Internal Medicine

## 2023-10-15 ENCOUNTER — Encounter: Payer: Self-pay | Admitting: Internal Medicine

## 2023-10-15 VITALS — BP 120/80 | HR 73 | Temp 98.4°F | Ht 62.0 in | Wt 144.2 lb

## 2023-10-15 DIAGNOSIS — R059 Cough, unspecified: Secondary | ICD-10-CM | POA: Diagnosis not present

## 2023-10-15 DIAGNOSIS — R0602 Shortness of breath: Secondary | ICD-10-CM

## 2023-10-15 DIAGNOSIS — J4540 Moderate persistent asthma, uncomplicated: Secondary | ICD-10-CM

## 2023-10-15 DIAGNOSIS — J4520 Mild intermittent asthma, uncomplicated: Secondary | ICD-10-CM

## 2023-10-15 LAB — NITRIC OXIDE: Nitric Oxide: 65

## 2023-10-15 MED ORDER — BUDESONIDE-FORMOTEROL FUMARATE 160-4.5 MCG/ACT IN AERO: 2.0000 | INHALATION_SPRAY | Freq: Two times a day (BID) | RESPIRATORY_TRACT | 12 refills | Status: DC

## 2023-10-15 NOTE — Progress Notes (Signed)
 [facility] [place] Pulmonary Medicine Consultation      Date: 10/15/2023,   MRN# [id number] Kirsten Perez 08-12-1940    CHIEF COMPLAINT:   Assessment of cough   HISTORY OF PRESENT ILLNESS   83 year old pleasant white female seen today for signs symptoms of cough Chronic in nature over the last several years Previously seen by Dr. Darlean diagnosed with upper airway cough syndrome  Upon further investigation, absolute eosinophil count was over 1,000 IgE levels around 600  There is a possibility patient may have cough variant asthma Patient does have significant allergic rhinitis Patient has tried multiple nasal sprays and antihistamines in the past  Patient does have a history of ulcer and a history of GERD seen by GI was given Pepcid  and sucralfate   No exacerbation at this time No evidence of heart failure at this time No evidence or signs of infection at this time No respiratory distress No fevers, chills, nausea, vomiting, diarrhea No evidence of lower extremity edema No evidence hemoptysis   Assessment of ASTHMA FeNO  65  ppb-Elevated exhaled Nitric oxide  testing is highly consistent with type II inflammation -continue inhalers as prescribed    PAST MEDICAL HISTORY   Past Medical History:  Diagnosis Date   Aortic atherosclerosis (HCC)    Arthritis    Atopic dermatitis    Diabetes mellitus without complication (HCC)    Gastric ulcer    Hyperlipidemia    Wears contact lenses      SURGICAL HISTORY   Past Surgical History:  Procedure Laterality Date   CATARACT EXTRACTION W/PHACO Right 05/06/2023   Procedure: PHACOEMULSIFICATION, CATARACT, WITH IOL INSERTION 7.31 00:35.3;  Surgeon: Mittie Gaskin, MD;  Location: [facility];  Service: Ophthalmology;  Laterality: Right;   CATARACT EXTRACTION W/PHACO Left 05/20/2023   Procedure: PHACOEMULSIFICATION, CATARACT, WITH IOL INSERTION 5.32 00:34.5;  Surgeon: Mittie Gaskin, MD;  Location: [facility];  Service: Ophthalmology;  Laterality: Left;   CHOLECYSTECTOMY     ROTATOR CUFF REPAIR Right      FAMILY HISTORY   Family History  Problem Relation Age of Onset   Hypertension Mother    Stroke Mother    Heart disease Father      SOCIAL HISTORY   Social History   Tobacco Use   Smoking status: Former    Current packs/day: 0.00    Types: Cigarettes    Quit date: 02/27/2014    Years since quitting: 9.6   Smokeless tobacco: Never   Tobacco comments:    Smoked a pack or less a week.  Vaping Use   Vaping status: Never Used  Substance Use Topics   Alcohol use: Never   Drug use: Never     MEDICATIONS    Home Medication:  Current Outpatient Rx   Order #: [id number] Class: Normal   Order #: [id number] Class: Normal   Order #: [id number] Class: Normal   Order #: [id number] Class: Normal   Order #: [id number] Class: Historical Med   Order #: [id number] Class: Normal   Order #: [id number] Class: Normal    Current Medication:  Current Outpatient Medications:    betamethasone  dipropionate 0.05 % cream, Apply topically 2 (two) times daily., Disp: 30 g, Rfl: 0   Continuous Glucose Sensor (FREESTYLE LIBRE 3 PLUS SENSOR) MISC, Change sensor every 15 days. (Patient not taking: Reported on 08/25/2023), Disp: 6 each, Rfl: 1   ezetimibe  (ZETIA ) 10 MG tablet, Take 1 tablet (10 mg total) by mouth daily., Disp: 90 tablet, Rfl: 1   famotidine  (PEPCID ) 20 MG tablet, TAKE  ONE TABLET BY MOUTH ONE TIME DAILY, Disp: 90 tablet, Rfl: 0   Multiple Vitamin (MULTIVITAMIN) capsule, Take 1 capsule by mouth daily., Disp: , Rfl:    pantoprazole  (PROTONIX ) 40 MG tablet, Take 1 tablet (40 mg total) by mouth daily., Disp: 90 tablet, Rfl: 3   sucralfate  (CARAFATE ) 1 g tablet, TAKE 1 TABLET 4 TIMES DAILY WITH MEALS AND AT BEDTIME., Disp: 360 tablet, Rfl: 0    ALLERGIES   Statins and Sulfa antibiotics  BP 120/80   Pulse 73   Temp 98.4 F (36.9 C)   Ht 5' 2 (1.575 m)   Wt 144 lb 3.2 oz (65.4 kg)   SpO2  95%   BMI 26.37 kg/m   Review of Systems: Gen:  Denies  fever, sweats, chills weight loss  HEENT: Denies blurred vision, double vision, ear pain, eye pain, hearing loss, nose bleeds, sore throat Cardiac:  No dizziness, chest pain or heaviness, chest tightness,edema, No JVD Resp:   + cough, -sputum production, +shortness of breath,+wheezing, -hemoptysis,  Other:  All other systems negative   Physical Examination:   General Appearance: No distress  EYES PERRLA, EOM intact.   NECK Supple, No JVD Pulmonary: normal breath sounds, No wheezing.  CardiovascularNormal S1,S2.  No m/r/g.   Abdomen: Benign, Soft, non-tender. Neurology UE/LE 5/5 strength, no focal deficits Ext pulses intact, cap refill intact ALL OTHER ROS ARE NEGATIVE   CT chest 2023 reviewed in detail with patient No significant abnormalities May consider air trapping   ASSESSMENT/PLAN   83 year old pleasant white female seen today for cough likely related to cough variant asthma in association with upper airway cough syndrome with chronic allergic rhinitis  Recommend pulmonary function testing Recommend starting Symbicort  2 puffs in the morning 2 puffs at night Rinse mouth after use Continue medications for acid reflux and ulcer No indication for antibiotics or prednisone at this time No exacerbation at this time No evidence of heart failure at this time No evidence or signs of infection at this time No respiratory distress No fevers, chills, nausea, vomiting, diarrhea No evidence of lower extremity edema No evidence hemoptysis Avoid Allergens and Irritants Avoid secondhand smoke Avoid SICK contacts Recommend  Masking  when appropriate Recommend Keep up-to-date with vaccinations    CURRENT MEDICATIONS REVIEWED AT LENGTH WITH PATIENT TODAY   Patient  satisfied with Plan of action and management. All questions answered   Follow up 4 weeks   I spent a total of 50 minutes dedicated to the care of this  patient on the date of this encounter to include pre-visit review of records, face-to-face time with the patient discussing conditions above, post visit ordering of testing, clinical documentation with the electronic health record, making appropriate referrals as documented, and communicating necessary information to the patient's healthcare team.    The Patient requires high complexity decision making for assessment and support, frequent evaluation and titration of therapies, application of advanced monitoring technologies and extensive interpretation of multiple databases.  Patient satisfied with Plan of action and management. All questions answered    Nickolas Alm Cellar, M.D.  [facility] Pulmonary & Critical Care Medicine  Medical Director [facility]

## 2023-10-15 NOTE — Patient Instructions (Signed)
 Recommend pulmonary function testing to assess lung function Start Symbicort  inhaler 2 puffs in the morning 2 puffs at night Please rinse mouth after use  Avoid Allergens and Irritants Avoid secondhand smoke Avoid SICK contacts Recommend  Masking  when appropriate Recommend Keep up-to-date with vaccinations

## 2023-10-16 ENCOUNTER — Encounter: Payer: Self-pay | Admitting: Student

## 2023-10-16 ENCOUNTER — Ambulatory Visit: Attending: Student | Admitting: Student

## 2023-10-16 VITALS — BP 114/80 | HR 64 | Ht 62.0 in | Wt 145.0 lb

## 2023-10-16 DIAGNOSIS — R0602 Shortness of breath: Secondary | ICD-10-CM

## 2023-10-16 DIAGNOSIS — R053 Chronic cough: Secondary | ICD-10-CM

## 2023-10-16 DIAGNOSIS — R0789 Other chest pain: Secondary | ICD-10-CM

## 2023-10-16 DIAGNOSIS — E7801 Familial hypercholesterolemia: Secondary | ICD-10-CM | POA: Diagnosis not present

## 2023-10-16 NOTE — Progress Notes (Signed)
 Cardiology Clinic Note   Date: 10/16/2023 ID: Jkayla Spiewak, DOB [birth year], MRN [id number]  Primary Care Provider: Bernardo Fend, DO  Primary Cardiologist:  None  Chief Complaint   Kirsten Perez is a 83 y.o. female who presents to the clinic today for new patient visit at the request of Bernardo Fend, DO.   Patient Profile      Past medical history significant for: Hyperlipidemia.  Lipid panel 01/08/2023: LDL 222, HDL 66, TG 129, total 350. T2DM.  Gastric ulcer.  Asthma.   In summary, patient was evaluated by PCP in July 2025 for chronic cough with wheezing. Patient described chest pressure and dyspnea when turning over in bed but not with exertional activities. She is pending echo scheduled for 9/26.      History of Present Illness    Today, patient states I don't know why I am here. She reports back in July she was experiencing a persistent cough and wheezing with associated chest tightness. She did not have chest tightness at any other time. She reports at that time she had been started on Repatha  which she felt made her cough and wheezing worse. She states as soon as she stopped the Repatha  cough improved and eventually resolved completely. Over the last 3 weeks she developed a cough again that is different than previous cough. She states it feels like a tickle in her throat. She has been evaluated by Dr. Isaiah in pulmonology and was diagnosed with asthma. She denies lower extremity edema, orthopnea or PND. She is active walking on the treadmill for 20 minutes 3 times a week. She also has a 85 year old granddaughter who she cares for on occasion with no limitations. She lives with her son. Family history includes father with MI at age 29 who passed away when he was 83 years old.      ROS: All other systems reviewed and are otherwise negative except as noted in History of Present Illness.  EKGs/Labs Reviewed    EKG Interpretation Date/Time:  Friday October 16 2023 14:05:28 EDT Ventricular Rate:  64 PR Interval:  168 QRS Duration:  78 QT Interval:  408 QTC Calculation: 420 R Axis:   -27  Text Interpretation: Normal sinus rhythm Minimal voltage criteria for LVH, may be normal variant ( R in aVL ) Possible Anterior infarct , age undetermined No previous ECGs available Confirmed by Loistine Sober [phone]) on 10/16/2023 2:09:22 PM   07/07/2023: ALT 10; AST 22; BUN 22; Creat 0.84; Potassium 5.0; Sodium 140   07/07/2023: Hemoglobin 15.5; WBC 9.9    Physical Exam    VS:  BP 114/80 (BP Location: Left Arm, Patient Position: Sitting, Cuff Size: Normal)   Pulse 64   Ht 5' 2 (1.575 m)   Wt 145 lb (65.8 kg)   SpO2 97%   BMI 26.52 kg/m  , BMI Body mass index is 26.52 kg/m.  GEN: Well nourished, well developed, in no acute distress. Neck: No JVD or carotid bruits. Cardiac:  RRR.  No murmur. No rubs or gallops.   Respiratory:  Respirations regular and unlabored. Clear to auscultation without rales, wheezing or rhonchi. GI: Soft, nontender, nondistended. Extremities: Radials/DP/PT 2+ and equal bilaterally. No clubbing or cyanosis. No edema.  Skin: Warm and dry, no rash. Neuro: Strength intact.  Assessment & Plan   Chest pain/dyspnea Patient reports chest pressure with cough occurring in July that has since resolved. No chest pressure, pain or tightness at any other time. Wheezing has also resolved.  She denies shortness of breath. She is active walking on the treadmill for 20 minutes 3 times a week with no symptoms. Discussed upcoming echo. She requests that it be cancel as she is not interested in undergoing any further testing.  - Cancel echo per patient request.   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 Patient reports myalgias with statins. She developed worsening cough and wheezing with Repatha  that resolved when stopped. She is not interested in trying any other medications.  - Continue Zetia .  - Continue to follow with PCP.    Disposition: Cancel echo per patient request. Follow up as needed.          Signed, Barnie HERO. Kayleb Warshaw, DNP, NP-C

## 2023-10-16 NOTE — Patient Instructions (Signed)
 Medication Instructions:  Your physician recommends that you continue on your current medications as directed. Please refer to the Current Medication list given to you today.   *If you need a refill on your cardiac medications before your next appointment, please call your pharmacy*  Lab Work: None ordered at this time  If you have labs (blood work) drawn today and your tests are completely normal, you will receive your results only by: MyChart Message (if you have MyChart) OR A paper copy in the mail If you have any lab test that is abnormal or we need to change your treatment, we will call you to review the results.  Testing/Procedures:  Your scheduled Echocardiogram has been cancelled.   Follow-Up: At [facility], you and your health needs are our priority.  As part of our continuing mission to provide you with exceptional heart care, our providers are all part of one team.  This team includes your primary Cardiologist (physician) and Advanced Practice Providers or APPs (Physician Assistants and Nurse Practitioners) who all work together to provide you with the care you need, when you need it.  Your next appointment:    As Needed  Provider:   You may see one of the following Advanced Practice Providers on your designated Care Team:   Lonni Meager, NP Lesley Maffucci, PA-C Bernardino Bring, PA-C Cadence Whitewater, PA-C Tylene Lunch, NP Barnie Hila, NP    We recommend signing up for the patient portal called MyChart.  Sign up information is provided on this After Visit Summary.  MyChart is used to connect with patients for Virtual Visits (Telemedicine).  Patients are able to view lab/test results, encounter notes, upcoming appointments, etc.  Non-urgent messages can be sent to your provider as well.   To learn more about what you can do with MyChart, go to [email or web address].

## 2023-10-23 ENCOUNTER — Ambulatory Visit

## 2023-11-24 ENCOUNTER — Other Ambulatory Visit: Payer: Self-pay | Admitting: Internal Medicine

## 2023-11-24 DIAGNOSIS — Z8711 Personal history of peptic ulcer disease: Secondary | ICD-10-CM

## 2023-11-25 NOTE — Telephone Encounter (Signed)
 Requested Prescriptions  Pending Prescriptions Disp Refills   famotidine  (PEPCID ) 20 MG tablet [Pharmacy Med Name: FAMOTIDINE  20 MG TAB[*]] 90 tablet 1    Sig: TAKE ONE TABLET BY MOUTH ONE TIME DAILY     Gastroenterology:  H2 Antagonists Passed - 11/25/2023  5:24 PM      Passed - Valid encounter within last 12 months    Recent Outpatient Visits           3 months ago Chronic cough   [facility] Bernardo Fend, DO   4 months ago Dyslipidemia associated with type 2 diabetes mellitus [facility])   [facility] Sowles, Krichna, MD

## 2023-12-02 ENCOUNTER — Other Ambulatory Visit: Payer: Self-pay | Admitting: Internal Medicine

## 2023-12-02 DIAGNOSIS — Z8711 Personal history of peptic ulcer disease: Secondary | ICD-10-CM

## 2023-12-03 NOTE — Telephone Encounter (Signed)
 Requested Prescriptions  Refused Prescriptions Disp Refills   sucralfate  (CARAFATE ) 1 g tablet [Pharmacy Med Name: SUCRALFATE  1 GM TAB] 360 tablet 0    Sig: TAKE ONE TABLET BY MOUTH FOUR TIMES A DAY WITH MEALS AND AT BEDTIME     Gastroenterology: Antiacids Passed - 12/03/2023  3:45 PM      Passed - Valid encounter within last 12 months    Recent Outpatient Visits           3 months ago Chronic cough   [facility] Bernardo Fend, DO   4 months ago Dyslipidemia associated with type 2 diabetes mellitus [facility])   [facility] Glenard Mire, MD

## 2023-12-08 ENCOUNTER — Encounter

## 2023-12-08 ENCOUNTER — Ambulatory Visit: Admitting: Internal Medicine

## 2023-12-16 ENCOUNTER — Other Ambulatory Visit: Payer: Self-pay | Admitting: Internal Medicine

## 2023-12-16 ENCOUNTER — Other Ambulatory Visit: Payer: Self-pay | Admitting: Family Medicine

## 2023-12-16 DIAGNOSIS — I700 Atherosclerosis of aorta: Secondary | ICD-10-CM

## 2023-12-16 DIAGNOSIS — Z8711 Personal history of peptic ulcer disease: Secondary | ICD-10-CM

## 2023-12-16 DIAGNOSIS — E1169 Type 2 diabetes mellitus with other specified complication: Secondary | ICD-10-CM

## 2023-12-18 NOTE — Telephone Encounter (Signed)
 Requested Prescriptions  Pending Prescriptions Disp Refills   sucralfate  (CARAFATE ) 1 g tablet [Pharmacy Med Name: SUCRALFATE  1 GM TAB] 360 tablet 0    Sig: TAKE ONE TABLET BY MOUTH FOUR TIMES A DAY WITH MEALS AND AT BEDTIME     Gastroenterology: Antiacids Passed - 12/18/2023  4:11 PM      Passed - Valid encounter within last 12 months    Recent Outpatient Visits           3 months ago Chronic cough   [facility] Bernardo Fend, DO   5 months ago Dyslipidemia associated with type 2 diabetes mellitus [facility])   [facility] Sowles, Krichna, MD

## 2023-12-18 NOTE — Telephone Encounter (Signed)
 Requested Prescriptions  Pending Prescriptions Disp Refills   ezetimibe  (ZETIA ) 10 MG tablet [Pharmacy Med Name: EZETIMIBE  10 MG TAB[*]] 90 tablet 1    Sig: TAKE ONE TABLET BY MOUTH ONE TIME DAILY     Cardiovascular:  Antilipid - Sterol Transport Inhibitors Failed - 12/18/2023  4:10 PM      Failed - Lipid Panel in normal range within the last 12 months    Cholesterol  Date Value Ref Range Status  01/08/2023 315 (H) <200 mg/dL Final   LDL Cholesterol (Calc)  Date Value Ref Range Status  01/08/2023 222 (H) mg/dL (calc) Final    Comment:    LDL-C levels > or = 190 mg/dL may indicate familial  hypercholesterolemia (FH). Clinical assessment and  measurement of blood lipid levels should be  considered for all first degree relatives of patients with an FH diagnosis. LDL Cholesterol (LDL-C) levels > or = 300 mg/dL may indicate homozygous familial hypercholesterolemia (HoFH). Untreated,  these extremely high LDL-C levels can result in premature CV events and mortality. Patients should be identified early and provided appropriate interventions to reduce the cumulative LDL-C burden from birth. . For questions about testing for familial hypercholesterolemia, please call [employer] at 1.866.GENE.INFO. SABRA Veatrice DASEN, et al. J National Lipid Association  Recommendations for Patient-Centered Management of Dyslipidemia: Part 1 Journal of  Clinical Lipidology 2015;9(2), 129-169. Cuchel, M. et al. (2014). Homozygous familial hypercholesterolaemia: new insights and guidance for clinicians to improve detection and clinical management. European Heart Journal, 35(32), 435 513 7608. Reference range: <100 . Desirable range <100 mg/dL for primary prevention;   <70 mg/dL for patients with CHD or diabetic patients  with > or = 2 CHD risk factors. SABRA LDL-C is now calculated using the Martin-Hopkins  calculation, which is a validated novel method providing  better accuracy than the  Friedewald equation in the  estimation of LDL-C.  Gladis APPLETHWAITE et al. SANDREA. 7986;689(80): 2061-2068  (http://education.QuestDiagnostics.com/faq/FAQ164)    HDL  Date Value Ref Range Status  01/08/2023 66 > OR = 50 mg/dL Final   Triglycerides  Date Value Ref Range Status  01/08/2023 129 <150 mg/dL Final         Passed - AST in normal range and within 360 days    AST  Date Value Ref Range Status  07/07/2023 22 10 - 35 U/L Final         Passed - ALT in normal range and within 360 days    ALT  Date Value Ref Range Status  07/07/2023 10 6 - 29 U/L Final         Passed - Patient is not pregnant      Passed - Valid encounter within last 12 months    Recent Outpatient Visits           3 months ago Chronic cough   [facility] Bernardo Fend, DO   5 months ago Dyslipidemia associated with type 2 diabetes mellitus [facility])   [facility] Sowles, Krichna, MD

## 2024-01-06 ENCOUNTER — Ambulatory Visit: Admitting: Internal Medicine

## 2024-01-06 ENCOUNTER — Other Ambulatory Visit: Payer: Self-pay | Admitting: Internal Medicine

## 2024-01-08 NOTE — Telephone Encounter (Signed)
 Requested medications are due for refill today.  yes  Requested medications are on the active medications list.  yes  Last refill. 09/14/2023 30g 1 rf  Future visit scheduled.   yes  Notes to clinic.  Medication not assigned to a protocol. Please review for refill.     Requested Prescriptions  Pending Prescriptions Disp Refills   betamethasone  dipropionate 0.05 % cream [Pharmacy Med Name: BETAMETHASONE  DP 0.05% CRM] 30 g 0    Sig: APPLY TOPICALLY TWICE DAILY AS DIRECTED     Off-Protocol Failed - 01/08/2024  7:42 AM      Failed - Medication not assigned to a protocol, review manually.      Passed - Valid encounter within last 12 months    Recent Outpatient Visits           4 months ago Chronic cough   [facility] Bernardo Fend, DO   6 months ago Dyslipidemia associated with type 2 diabetes mellitus [facility])   [facility] Sowles, Krichna, MD

## 2024-01-09 ENCOUNTER — Other Ambulatory Visit: Payer: Self-pay | Admitting: Internal Medicine

## 2024-01-12 NOTE — Telephone Encounter (Signed)
 Duplicate request.  Requested Prescriptions  Pending Prescriptions Disp Refills   betamethasone  dipropionate 0.05 % cream [Pharmacy Med Name: BETAMETHASONE  DP 0.05% CRM] 30 g 0    Sig: APPLY TOPICALLY TWICE DAILY AS DIRECTED     Off-Protocol Failed - 01/12/2024  2:42 PM      Failed - Medication not assigned to a protocol, review manually.      Passed - Valid encounter within last 12 months    Recent Outpatient Visits           4 months ago Chronic cough   [facility] Bernardo Fend, DO   6 months ago Dyslipidemia associated with type 2 diabetes mellitus [facility])   [facility] Sowles, Krichna, MD

## 2024-01-17 ENCOUNTER — Other Ambulatory Visit: Payer: Self-pay | Admitting: Internal Medicine

## 2024-01-20 NOTE — Telephone Encounter (Signed)
 Requested medication (s) are due for refill today: routing for review  Requested medication (s) are on the active medication list: yes  Last refill:  n/a  Future visit scheduled: no  Notes to clinic:  Medication not assigned to a protocol, review manually.      Requested Prescriptions  Pending Prescriptions Disp Refills   augmented betamethasone  dipropionate (DIPROLENE -AF) 0.05 % ointment [Pharmacy Med Name: BETAMETHASON DP AUG 0.05% OIN] 45 g 1    Sig: APPLY TWICE DAILY TO AFFECTED AREAS UNTIL RESOLVED, THEN DISCONTINUE (DO NOT USE ON FACE)     Off-Protocol Failed - 01/20/2024 10:40 AM      Failed - Medication not assigned to a protocol, review manually.      Passed - Valid encounter within last 12 months    Recent Outpatient Visits           4 months ago Chronic cough   [facility] Bernardo Fend, DO   6 months ago Dyslipidemia associated with type 2 diabetes mellitus [facility])   [facility] Sowles, Krichna, MD

## 2024-02-03 ENCOUNTER — Ambulatory Visit
Admission: RE | Admit: 2024-02-03 | Discharge: 2024-02-03 | Disposition: A | Discharge status: Home / Self Care | Source: Ambulatory Visit | Attending: Family Medicine | Admitting: Family Medicine

## 2024-02-03 ENCOUNTER — Encounter: Payer: Self-pay | Admitting: Family Medicine

## 2024-02-03 ENCOUNTER — Ambulatory Visit: Admitting: Family Medicine

## 2024-02-03 ENCOUNTER — Ambulatory Visit: Payer: Self-pay | Admitting: Family Medicine

## 2024-02-03 ENCOUNTER — Ambulatory Visit
Admission: RE | Admit: 2024-02-03 | Discharge: 2024-02-03 | Disposition: A | Discharge status: Home / Self Care | Attending: Family Medicine | Admitting: Family Medicine

## 2024-02-03 VITALS — BP 128/76 | HR 95 | Resp 16 | Ht 62.0 in | Wt 142.0 lb

## 2024-02-03 DIAGNOSIS — R051 Acute cough: Secondary | ICD-10-CM | POA: Insufficient documentation

## 2024-02-03 DIAGNOSIS — R197 Diarrhea, unspecified: Secondary | ICD-10-CM

## 2024-02-03 DIAGNOSIS — J189 Pneumonia, unspecified organism: Secondary | ICD-10-CM

## 2024-02-03 LAB — POC COVID19/FLU A&B COMBO
Covid Antigen, POC: NEGATIVE
Influenza A Antigen, POC: NEGATIVE
Influenza B Antigen, POC: NEGATIVE

## 2024-02-03 MED ORDER — CEFDINIR 300 MG PO CAPS: 300.0000 mg | ORAL_CAPSULE | Freq: Two times a day (BID) | ORAL | 0 refills | Status: AC

## 2024-02-03 NOTE — Progress Notes (Signed)
 "  Acute Office Visit  Subjective:     Patient ID: Kirsten Perez, female    DOB: [birth year], 84 y.o.   MRN: [id number]  Chief Complaint  Patient presents with   Cough    X2 weeks   Diarrhea    On/off x1 week   Chest Congestion    HPI Patient is in today for complaints of cough for the last two weeks. She is a new patient to me. She describes cough to be productive. She also describes chest congestion and chest pressure. She voices some discomfort with deep inspiration. She denies fever but does endorse chills. She denies shortness of breath. She denies sinus congestion or sinus pressure. She denies headaches. She denies body aches but states she has been weak. She does endorse poor appetite . She does endorse associated diarrhea, describing stools to be loose and watery. She reports diarrhea to be intermittent for the last one week. Denying abdominal pain, nausea or vomiting.   Review of Systems  Constitutional:  Positive for chills. Negative for fever.  HENT:  Negative for congestion, ear pain, sinus pain and sore throat.   Respiratory:  Positive for cough and sputum production. Negative for shortness of breath and wheezing.   Gastrointestinal:  Positive for diarrhea. Negative for abdominal pain, nausea and vomiting.  Neurological:  Positive for weakness. Negative for dizziness and headaches.        Objective:    BP 128/76   Pulse 95   Resp 16   Ht 5' 2 (1.575 m)   Wt 142 lb (64.4 kg)   SpO2 99%   BMI 25.97 kg/m    Physical Exam Constitutional:      Appearance: She is ill-appearing. She is not toxic-appearing.  Cardiovascular:     Rate and Rhythm: Normal rate and regular rhythm.  Pulmonary:     Effort: Pulmonary effort is normal.     Breath sounds: Rhonchi present.  Abdominal:     General: Abdomen is flat. Bowel sounds are normal. There is no distension.     Palpations: Abdomen is soft.     Tenderness: There is no abdominal tenderness. There is no guarding.   Skin:    General: Skin is warm and dry.  Neurological:     General: No focal deficit present.     Mental Status: She is alert.  Psychiatric:        Mood and Affect: Mood normal.        Behavior: Behavior normal.     Results for orders placed or performed in visit on 02/03/24  POC Covid19/Flu A&B Antigen  Result Value Ref Range   Influenza A Antigen, POC Negative Negative   Influenza B Antigen, POC Negative Negative   Covid Antigen, POC Negative Negative        Assessment & Plan:   Assessment & Plan Acute cough Patient is an 84 year old female seen in office today for complaints of cough and chest congestion. She describes cough to be productive with a moderate amount of sputum. She denies shortness of breath but voices complaints of chest congestion describing feelings of pressure. She does endorse discomfort with deep breaths during exam. She voices sickness has been going through her home.   Rapid COVID and flu testing negative. V/s stable. Rhonci of bilateral upper lobes, right middle lobe and right lower lobe.   -Chest x-ray ordered STAT -Start Cefdinir  300mg  BID x 7 days -Return precautions advised Orders:   POC Covid19/Flu A&B Antigen  DG Chest 2 View; Future   cefdinir  (OMNICEF ) 300 MG capsule; Take 1 capsule (300 mg total) by mouth 2 (two) times daily with a meal.  Diarrhea, unspecified type Intermittent diarrhea for the last one week. She denies associated fever, abdominal pain, nausea or vomiting. She voices stools are loose and watery. Denies recent antibiotic use.   -Recommended BRAT diet and increasing fluid intake -Recommended OTC Imodium as needed for diarrhea -Recommended OTC probiotic while symptoms present and during antibiotic use -Return precautions advised and red flag symptoms reviewed.         Return if symptoms worsen or fail to improve.  LAYMON LOISE CORE, FNP   "

## 2024-02-04 ENCOUNTER — Telehealth: Payer: Self-pay

## 2024-02-04 ENCOUNTER — Other Ambulatory Visit: Payer: Self-pay | Admitting: Internal Medicine

## 2024-02-04 ENCOUNTER — Telehealth: Payer: Self-pay | Admitting: Internal Medicine

## 2024-02-04 DIAGNOSIS — R051 Acute cough: Secondary | ICD-10-CM

## 2024-02-04 MED ORDER — BENZONATATE 100 MG PO CAPS: 100.0000 mg | ORAL_CAPSULE | Freq: Two times a day (BID) | ORAL | 0 refills | Status: AC | PRN

## 2024-02-04 NOTE — Telephone Encounter (Signed)
 Tried to call x3 no answer and no vm setup.  Omnicef  is a antibiotic and just started yesterday.  Does she want/need something for cough?

## 2024-02-04 NOTE — Telephone Encounter (Signed)
 Can you send in something for her cough?

## 2024-02-04 NOTE — Telephone Encounter (Signed)
 Copied from CRM #[id number]. Topic: Clinical - Prescription Issue >> Feb 04, 2024 10:18 AM Nessti S wrote: Reason for CRM: pt called because cefdinir  (OMNICEF ) 300 MG capsule that was prescribed is not working very well and she would like pcp to prescribe another cough med. >> Feb 04, 2024  2:22 PM Emylou G wrote: Patient called.. would like something for her cough.. she did return our call

## 2024-02-04 NOTE — Telephone Encounter (Signed)
 Copied from CRM #[id number]. Topic: Clinical - Prescription Issue >> Feb 04, 2024 10:18 AM Nessti S wrote: Reason for CRM: pt called because cefdinir  (OMNICEF ) 300 MG capsule that was prescribed is not working very well and she would like pcp to prescribe another cough med.

## 2024-02-09 ENCOUNTER — Telehealth: Payer: Self-pay

## 2024-02-09 NOTE — Telephone Encounter (Signed)
 Copied from CRM #[id number]. Topic: Clinical - Medical Advice >> Feb 09, 2024 10:12 AM Darshell M wrote: Reason for CRM: Patient being treated pneumonia and the flu. Patient reporting feeling better but still coughing. Patient wants to know if she can have follow up chest xray before February 1st. Also want to know if she is contagious is she still have a cough. Patient CB [phone]

## 2024-02-09 NOTE — Telephone Encounter (Signed)
 Pt.notified

## 2024-02-16 ENCOUNTER — Other Ambulatory Visit: Payer: Self-pay | Admitting: Internal Medicine

## 2024-02-16 DIAGNOSIS — E1165 Type 2 diabetes mellitus with hyperglycemia: Secondary | ICD-10-CM

## 2024-02-16 NOTE — Telephone Encounter (Signed)
 Requested medications are due for refill today.  yes  Requested medications are on the active medications list.  yes  Last refill. 05/01/2023 6 1 rf  Future visit scheduled.  yes  Notes to clinic.  Protocol will not attach.    Requested Prescriptions  Pending Prescriptions Disp Refills   Continuous Glucose Sensor (FREESTYLE LIBRE 3 PLUS SENSOR) MISC [Pharmacy Med Name: FREESTYLE LIBRE 3 PLUS KIT SENSOR[$]] 6 each 1    Sig: PLACE ONE SENSOR TO THE BACK OF YOUR UPPER ARM. REPLACE EVERY 15 DAYS.     There is no refill protocol information for this order

## 2024-02-23 ENCOUNTER — Other Ambulatory Visit: Payer: Self-pay | Admitting: Internal Medicine

## 2024-02-23 DIAGNOSIS — Z8711 Personal history of peptic ulcer disease: Secondary | ICD-10-CM

## 2024-02-24 NOTE — Telephone Encounter (Signed)
 Requested Prescriptions  Pending Prescriptions Disp Refills   pantoprazole  (PROTONIX ) 40 MG tablet [Pharmacy Med Name: PANTOPRAZOLE  DR 40 MG TAB[*]] 90 tablet 0    Sig: TAKE ONE TABLET BY MOUTH ONE TIME DAILY     Gastroenterology: Proton Pump Inhibitors Passed - 02/24/2024 11:41 AM      Passed - Valid encounter within last 12 months    Recent Outpatient Visits           3 weeks ago Acute cough   [facility] [place], Laymon SAILOR, FNP   6 months ago Chronic cough   [facility] Bernardo Fend, DO   7 months ago Dyslipidemia associated with type 2 diabetes mellitus [facility])   [facility] Sowles, Krichna, MD

## 2024-02-25 ENCOUNTER — Other Ambulatory Visit: Payer: Self-pay | Admitting: Internal Medicine

## 2024-02-25 ENCOUNTER — Ambulatory Visit
Admission: RE | Admit: 2024-02-25 | Discharge: 2024-02-25 | Disposition: A | Discharge status: Home / Self Care | Source: Ambulatory Visit | Attending: Family Medicine | Admitting: Family Medicine

## 2024-02-25 ENCOUNTER — Ambulatory Visit
Admission: RE | Admit: 2024-02-25 | Discharge: 2024-02-25 | Disposition: A | Discharge status: Home / Self Care | Attending: Family Medicine | Admitting: Family Medicine

## 2024-02-25 DIAGNOSIS — E1169 Type 2 diabetes mellitus with other specified complication: Secondary | ICD-10-CM

## 2024-02-25 DIAGNOSIS — Z8711 Personal history of peptic ulcer disease: Secondary | ICD-10-CM

## 2024-02-25 DIAGNOSIS — J189 Pneumonia, unspecified organism: Secondary | ICD-10-CM | POA: Insufficient documentation

## 2024-02-25 DIAGNOSIS — I700 Atherosclerosis of aorta: Secondary | ICD-10-CM

## 2024-02-26 NOTE — Telephone Encounter (Signed)
 Requested Prescriptions  Pending Prescriptions Disp Refills   ezetimibe  (ZETIA ) 10 MG tablet [Pharmacy Med Name: EZETIMIBE  10 MG TAB[*]] 90 tablet 0    Sig: TAKE ONE TABLET BY MOUTH ONE TIME DAILY     Cardiovascular:  Antilipid - Sterol Transport Inhibitors Failed - 02/26/2024 11:53 AM      Failed - Lipid Panel in normal range within the last 12 months    Cholesterol  Date Value Ref Range Status  01/08/2023 315 (H) <200 mg/dL Final   LDL Cholesterol (Calc)  Date Value Ref Range Status  01/08/2023 222 (H) mg/dL (calc) Final    Comment:    LDL-C levels > or = 190 mg/dL may indicate familial  hypercholesterolemia (FH). Clinical assessment and  measurement of blood lipid levels should be  considered for all first degree relatives of patients with an FH diagnosis. LDL Cholesterol (LDL-C) levels > or = 300 mg/dL may indicate homozygous familial hypercholesterolemia (HoFH). Untreated,  these extremely high LDL-C levels can result in premature CV events and mortality. Patients should be identified early and provided appropriate interventions to reduce the cumulative LDL-C burden from birth. . For questions about testing for familial hypercholesterolemia, please call Engineer, [employer] at [phone]. SABRA Veatrice DASEN, et al. J National Lipid Association  Recommendations for Patient-Centered Management of Dyslipidemia: Part 1 Journal of  Clinical Lipidology 2015;9(2), 129-169. Cuchel, M. et al. (2014). Homozygous familial hypercholesterolaemia: new insights and guidance for clinicians to improve detection and clinical management. European Heart Journal, 35(32), [phone]. Reference range: <100 . Desirable range <100 mg/dL for primary prevention;   <70 mg/dL for patients with CHD or diabetic patients  with > or = 2 CHD risk factors. SABRA LDL-C is now calculated using the Martin-Hopkins  calculation, which is a validated novel method providing  better accuracy than the  Friedewald equation in the  estimation of LDL-C.  Gladis APPLETHWAITE et al. SANDREA. 7986;689(80): 2061-2068  (http://education.[facility].com/faq/FAQ164)    HDL  Date Value Ref Range Status  01/08/2023 66 > OR = 50 mg/dL Final   Triglycerides  Date Value Ref Range Status  01/08/2023 129 <150 mg/dL Final         Passed - AST in normal range and within 360 days    AST  Date Value Ref Range Status  07/07/2023 22 10 - 35 U/L Final         Passed - ALT in normal range and within 360 days    ALT  Date Value Ref Range Status  07/07/2023 10 6 - 29 U/L Final         Passed - Patient is not pregnant      Passed - Valid encounter within last 12 months    Recent Outpatient Visits           3 weeks ago Acute cough   [facility] [place], Laymon SAILOR, FNP   6 months ago Chronic cough   [facility] Bernardo Fend, DO   7 months ago Dyslipidemia associated with type 2 diabetes mellitus [facility])   [facility] Sowles, Krichna, MD               famotidine  (PEPCID ) 20 MG tablet [Pharmacy Med Name: FAMOTIDINE  20 MG TAB[*]] 90 tablet 1    Sig: TAKE ONE TABLET BY MOUTH ONE TIME DAILY     Gastroenterology:  H2 Antagonists Passed - 02/26/2024 11:53 AM      Passed - Valid  encounter within last 12 months    Recent Outpatient Visits           3 weeks ago Acute cough   [facility], Laymon SAILOR, FNP   6 months ago Chronic cough   [facility] Bernardo Fend, DO   7 months ago Dyslipidemia associated with type 2 diabetes mellitus [facility])   [facility] Sowles, Krichna, MD

## 2024-02-28 ENCOUNTER — Ambulatory Visit: Payer: Self-pay | Admitting: Family Medicine

## 2024-02-29 ENCOUNTER — Telehealth: Payer: Self-pay

## 2024-02-29 NOTE — Telephone Encounter (Signed)
 Pharmacy Quality Measure Review  This patient is appearing on a report for being at risk of failing the adherence measure for diabetes medications this calendar year.   Medication: metformin  Last fill date: 06/27/23 for 90 day supply  Medication was discontinued by PCP on 08/25/23 d/t hypoglycemia. No action needed at this time.  Journie Howson E. Marsh, PharmD, BCACP, CPP Clinical Pharmacist [facility] [phone]

## 2024-03-31 ENCOUNTER — Ambulatory Visit: Payer: No Typology Code available for payment source
